# Patient Record
Sex: Female | Born: 1985 | Hispanic: No | Marital: Married | Smoking: Never smoker
Health system: Southern US, Community
[De-identification: ages and names within clinical notes are randomized; demographics above are authoritative.]

## PROBLEM LIST (undated history)

## (undated) ENCOUNTER — Inpatient Hospital Stay (HOSPITAL_COMMUNITY): Payer: Self-pay

## (undated) DIAGNOSIS — T7840XA Allergy, unspecified, initial encounter: Secondary | ICD-10-CM

## (undated) DIAGNOSIS — R51 Headache: Secondary | ICD-10-CM

## (undated) DIAGNOSIS — J45909 Unspecified asthma, uncomplicated: Secondary | ICD-10-CM

## (undated) HISTORY — PX: NO PAST SURGERIES: SHX2092

## (undated) HISTORY — DX: Allergy, unspecified, initial encounter: T78.40XA

## (undated) HISTORY — DX: Unspecified asthma, uncomplicated: J45.909

## (undated) HISTORY — PX: IUD REMOVAL: SHX5392

---

## 2009-01-09 ENCOUNTER — Emergency Department (HOSPITAL_COMMUNITY): Admission: EM | Admit: 2009-01-09 | Discharge: 2009-01-09 | Payer: Self-pay | Admitting: Emergency Medicine

## 2009-03-29 ENCOUNTER — Other Ambulatory Visit: Admission: RE | Admit: 2009-03-29 | Discharge: 2009-03-29 | Payer: Self-pay | Admitting: Obstetrics and Gynecology

## 2009-06-23 ENCOUNTER — Inpatient Hospital Stay (HOSPITAL_COMMUNITY): Admission: AD | Admit: 2009-06-23 | Discharge: 2009-06-24 | Payer: Self-pay | Admitting: Obstetrics and Gynecology

## 2009-09-13 ENCOUNTER — Ambulatory Visit (HOSPITAL_COMMUNITY): Admission: RE | Admit: 2009-09-13 | Discharge: 2009-09-13 | Payer: Self-pay | Admitting: Obstetrics and Gynecology

## 2009-09-22 ENCOUNTER — Inpatient Hospital Stay (HOSPITAL_COMMUNITY): Admission: AD | Admit: 2009-09-22 | Discharge: 2009-09-22 | Payer: Self-pay | Admitting: Obstetrics and Gynecology

## 2010-07-06 ENCOUNTER — Inpatient Hospital Stay (HOSPITAL_COMMUNITY): Admission: AD | Admit: 2010-07-06 | Discharge: 2009-10-09 | Payer: Self-pay | Admitting: Obstetrics and Gynecology

## 2010-10-23 LAB — CBC
HCT: 34.5 % — ABNORMAL LOW (ref 36.0–46.0)
Hemoglobin: 11 g/dL — ABNORMAL LOW (ref 12.0–15.0)
MCHC: 33 g/dL (ref 30.0–36.0)
MCHC: 33.1 g/dL (ref 30.0–36.0)
MCV: 86 fL (ref 78.0–100.0)
Platelets: 156 10*3/uL (ref 150–400)
Platelets: 192 10*3/uL (ref 150–400)
RBC: 4 MIL/uL (ref 3.87–5.11)
RDW: 13.6 % (ref 11.5–15.5)
WBC: 11.7 10*3/uL — ABNORMAL HIGH (ref 4.0–10.5)

## 2010-10-23 LAB — RPR: RPR Ser Ql: NONREACTIVE

## 2010-11-01 LAB — URINALYSIS, ROUTINE W REFLEX MICROSCOPIC
Ketones, ur: NEGATIVE mg/dL
Nitrite: NEGATIVE
Specific Gravity, Urine: 1.025 (ref 1.005–1.030)
pH: 5.5 (ref 5.0–8.0)

## 2010-11-06 LAB — COMPREHENSIVE METABOLIC PANEL
ALT: 12 U/L (ref 0–35)
AST: 17 U/L (ref 0–37)
Albumin: 3.6 g/dL (ref 3.5–5.2)
Alkaline Phosphatase: 57 U/L (ref 39–117)
BUN: 9 mg/dL (ref 6–23)
CO2: 23 mEq/L (ref 19–32)
Calcium: 9.3 mg/dL (ref 8.4–10.5)
Chloride: 105 mEq/L (ref 96–112)
Creatinine, Ser: 0.67 mg/dL (ref 0.4–1.2)
GFR calc Af Amer: >60 mL/min (ref >60)
GFR calc non Af Amer: >60 mL/min (ref >60)
Glucose, Bld: 91 mg/dL (ref 70–99)
Potassium: 4.2 mEq/L (ref 3.5–5.1)
Sodium: 136 mEq/L (ref 135–145)
Total Bilirubin: 0.6 mg/dL (ref 0.3–1.2)
Total Protein: 7.4 g/dL (ref 6.0–8.3)

## 2010-11-06 LAB — DIFFERENTIAL
Eosinophils Absolute: 0.1 10*3/uL (ref 0.0–0.7)
Eosinophils Relative: 2 % (ref 0–5)
Lymphs Abs: 1.7 10*3/uL (ref 0.7–4.0)
Monocytes Relative: 6 % (ref 3–12)

## 2010-11-06 LAB — WET PREP, GENITAL
Clue Cells Wet Prep HPF POC: NONE SEEN
Trich, Wet Prep: NONE SEEN
Yeast Wet Prep HPF POC: NONE SEEN

## 2010-11-06 LAB — URINALYSIS, ROUTINE W REFLEX MICROSCOPIC
Bilirubin Urine: NEGATIVE
Glucose, UA: NEGATIVE mg/dL
Urobilinogen, UA: 0.2 mg/dL (ref 0.0–1.0)
pH: 5 (ref 5.0–8.0)

## 2010-11-06 LAB — URINE CULTURE: Colony Count: 50000

## 2010-11-06 LAB — CBC
HCT: 38.6 % (ref 36.0–46.0)
Hemoglobin: 12.7 g/dL (ref 12.0–15.0)
MCHC: 33 g/dL (ref 30.0–36.0)
MCV: 81.3 fL (ref 78.0–100.0)
Platelets: 186 10*3/uL (ref 150–400)
RBC: 4.74 MIL/uL (ref 3.87–5.11)
RDW: 13.1 % (ref 11.5–15.5)
WBC: 6 10*3/uL (ref 4.0–10.5)

## 2010-11-06 LAB — GC/CHLAMYDIA PROBE AMP, GENITAL
Chlamydia, DNA Probe: NEGATIVE
GC Probe Amp, Genital: NEGATIVE

## 2010-11-19 ENCOUNTER — Inpatient Hospital Stay (HOSPITAL_COMMUNITY)
Admission: AD | Admit: 2010-11-19 | Discharge: 2010-11-19 | Disposition: A | Discharge status: Home / Self Care | Payer: 59 | Source: Ambulatory Visit | Attending: Obstetrics and Gynecology | Admitting: Obstetrics and Gynecology

## 2010-11-19 DIAGNOSIS — O479 False labor, unspecified: Secondary | ICD-10-CM | POA: Insufficient documentation

## 2010-11-20 ENCOUNTER — Inpatient Hospital Stay (HOSPITAL_COMMUNITY)
Admission: RE | Admit: 2010-11-20 | Discharge: 2010-11-22 | DRG: 775 | Disposition: A | Discharge status: Home / Self Care | Payer: 59 | Source: Ambulatory Visit | Attending: Obstetrics and Gynecology | Admitting: Obstetrics and Gynecology

## 2010-11-20 DIAGNOSIS — O99892 Other specified diseases and conditions complicating childbirth: Principal | ICD-10-CM | POA: Diagnosis present

## 2010-11-20 DIAGNOSIS — Z22330 Carrier of Group B streptococcus: Secondary | ICD-10-CM

## 2010-11-20 DIAGNOSIS — N90810 Female genital mutilation status, unspecified: Secondary | ICD-10-CM | POA: Diagnosis present

## 2010-11-20 LAB — CBC
HCT: 34 % — ABNORMAL LOW (ref 36.0–46.0)
Hemoglobin: 11 g/dL — ABNORMAL LOW (ref 12.0–15.0)
MCH: 26.1 pg (ref 26.0–34.0)
RBC: 4.22 MIL/uL (ref 3.87–5.11)
WBC: 8.5 10*3/uL (ref 4.0–10.5)

## 2010-11-21 LAB — CBC
HCT: 34.8 % — ABNORMAL LOW (ref 36.0–46.0)
RBC: 4.26 MIL/uL (ref 3.87–5.11)
RDW: 15 % (ref 11.5–15.5)
WBC: 12 10*3/uL — ABNORMAL HIGH (ref 4.0–10.5)

## 2010-12-05 NOTE — Discharge Summary (Signed)
NAMEKATHIA, [name]              ACCOUNT NO.:  [id number]  MEDICAL RECORD NO.:  [id number]           PATIENT TYPE:  I  LOCATION:  9114                          FACILITY:  [facility]  PHYSICIAN:  Sherron Monday, MD        DATE OF BIRTH:  Jan 28, 1986  DATE OF ADMISSION:  11/20/2010 DATE OF DISCHARGE:  11/22/2010                              DISCHARGE SUMMARY   ADMITTING DIAGNOSIS:  Intrauterine pregnancy at 39 plus weeks for induction of labor.  DISCHARGE DIAGNOSIS:  Intrauterine pregnancy at 39 plus weeks for induction of labor, delivered.  HISTORY OF PRESENT ILLNESS:  A 25 year old G2, P1-0-0-1 at 23 plus weeks for induction of labor given lagging abdominal circumference, favorable cervix, and fundal height less than dates.  She states she has had good fetal movement, no loss of fluid, no vaginal bleeding, and occasional contractions.  She started her prenatal care at [facility].  Has limited English.  Admits group B strep as well as had an UTI in her pregnancy, otherwise uncomplicated prenatal care.  PAST MEDICAL HISTORY:  Significant for asthma as a child.  PAST SURGICAL HISTORY:  Not significant.  PAST GYN HISTORY:  G1 was in October 01, 2009, 39 weeks, 5-pound and 6- ounce female infant.  G2 is present pregnancy.  No history of any abnormal Pap smears or sexually transmitted diseases.  MEDICATIONS:  Prenatal vitamins.  ALLERGIES:  PENICILLIN.  No latex allergy.  SOCIAL HISTORY:  Denies alcohol, tobacco, or drug use.  She is married.  FAMILY HISTORY:  Significant for diabetes.  PRENATAL LABS:  She is A positive, antibody screen negative, hemoglobin 11.4, Pap smear within normal limits, rubella nonimmune, RPR nonreactive, urine culture negative, hepatitis B surface antigen negative, HIV negative, platelets 223,000, gonorrhea negative, Chlamydia negative, cystic fibrosis screen negative, first trimester screen within normal limits, group B strep was positive.  Ultrasound  confirmed an [facility] of November 26, 2010, normal anatomy, anterior placenta.  The patient did not desire to note gender at that time.  PHYSICAL EXAMINATION:  VITAL SIGNS:  Afebrile.  Vital signs stable. GENERAL:  No apparent distress. CARDIOVASCULAR:  Regular rate and rhythm. LUNGS:  Clear to auscultation bilaterally. ABDOMEN:  Soft.  Fundus nontender. EXTREMITIES:  Symmetric and nontender.  Fetal heart tones are in the 140s and reactive with occasional contractions.  Cervix with a large external orifice, 4-5 cm dilated, 90% effaced, and 0 to +1 station.  She was admitted for induction of labor/augmentation.  She is group B strep positive with unknown sensitivities and penicillin allergic.  She will be treated with vancomycin as her reaction is an anaphylactic one.  Will be placed on SCD, epidural p.r.n., augment as needed with Pitocin, vancomycin.  She progressed rapidly, had an SROM for clear fluid.  An epidural was placed, never got comfortable.  She progressed to complete, complete, +2.  She pushed for 4-5 contractions to deliver a viable female infant at 9:27 a.m. with Apgars of 9 at 1 minute and 9 at 5 minutes and a weight of 5 pounds and 6 ounces.  Placenta was then expressed intact at 9:29.  The  patient want female circumcision.  Bilateral periurethral lacerations repaired with 3-0 Vicryl Rapide with local block.  EBL was less than 500 mL, did not get the full dose of vancomycin.  Her postpartum course was relatively uncomplicated.  Her hemoglobin was stable from 11 to 11.1.  She was discharged home on postpartum day #2 without complaints except mild dizziness.  The patient was advised to hydrate, otherwise her pain was well controlled, normal lochia.  She was discharged home with routine discharge instructions and numbers to call with any questions or problems, prescriptions for Motrin, Vicodin, and prenatal vitamins.  She will follow up in approximately 6 weeks.  She is A  positive, rubella nonimmune, will receive rubella injection prior to discharge.  She is breast-feeding.  Hemoglobin is stable from 11 to 11.1.  We will discuss contraception at her 6-week check.     Sherron Monday, MD     JB/MEDQ  D:  11/22/2010  T:  11/22/2010  Job:  [id number]  Electronically Signed by Sherron Monday MD on 12/05/2010 08:49:29 AM

## 2012-05-01 ENCOUNTER — Ambulatory Visit (INDEPENDENT_AMBULATORY_CARE_PROVIDER_SITE_OTHER): Payer: 59 | Admitting: Family Medicine

## 2012-05-01 VITALS — BP 104/69 | HR 90 | Temp 98.3°F | Resp 16 | Ht 64.0 in | Wt 149.2 lb

## 2012-05-01 DIAGNOSIS — J029 Acute pharyngitis, unspecified: Secondary | ICD-10-CM

## 2012-05-01 DIAGNOSIS — R42 Dizziness and giddiness: Secondary | ICD-10-CM

## 2012-05-01 DIAGNOSIS — M549 Dorsalgia, unspecified: Secondary | ICD-10-CM

## 2012-05-01 DIAGNOSIS — R110 Nausea: Secondary | ICD-10-CM

## 2012-05-01 DIAGNOSIS — R300 Dysuria: Secondary | ICD-10-CM

## 2012-05-01 DIAGNOSIS — R6883 Chills (without fever): Secondary | ICD-10-CM

## 2012-05-01 LAB — POCT CBC
Granulocyte percent: 69.3 % (ref 37–80)
HCT, POC: 40.6 % (ref 37.7–47.9)
Hemoglobin: 13.3 g/dL (ref 12.2–16.2)
Lymph, poc: 2.1 (ref 0.6–3.4)
MCH, POC: 27.8 pg (ref 27–31.2)
MCHC: 32.8 g/dL (ref 31.8–35.4)
MCV: 84.7 fL (ref 80–97)
MID (cbc): 0.4 (ref 0–0.9)
MPV: 8.8 fL (ref 0–99.8)
POC Granulocyte: 5.8 (ref 2–6.9)
POC LYMPH PERCENT: 25.9 %L (ref 10–50)
POC MID %: 4.8 %M (ref 0–12)
Platelet Count, POC: 223 10*3/uL (ref 142–424)
RBC: 4.79 M/uL (ref 4.04–5.48)
RDW, POC: 13.7 %
WBC: 8.3 10*3/uL (ref 4.6–10.2)

## 2012-05-01 LAB — POCT UA - MICROSCOPIC ONLY
Casts, Ur, LPF, POC: NEGATIVE
Crystals, Ur, HPF, POC: NEGATIVE
Yeast, UA: NEGATIVE

## 2012-05-01 LAB — POCT URINALYSIS DIPSTICK
Bilirubin, UA: NEGATIVE
Glucose, UA: NEGATIVE
Ketones, UA: NEGATIVE
Leukocytes, UA: NEGATIVE
Nitrite, UA: NEGATIVE
Protein, UA: NEGATIVE
Spec Grav, UA: 1.01
Urobilinogen, UA: 0.2
pH, UA: 5

## 2012-05-01 LAB — POCT RAPID STREP A (OFFICE): Rapid Strep A Screen: NEGATIVE

## 2012-05-01 LAB — POCT URINE PREGNANCY: Preg Test, Ur: NEGATIVE

## 2012-05-01 MED ORDER — CIPROFLOXACIN HCL 250 MG PO TABS: 250.0000 mg | ORAL_TABLET | Freq: Two times a day (BID) | ORAL | Status: DC

## 2012-05-01 NOTE — Progress Notes (Signed)
[facility]:  Office Visit  Chief Complaint:  Chief Complaint  Patient presents with  . Back Pain    3 days  . Sore Throat    3 days  . Dizziness  . abdomen pain    HPI: [name] is a 26 y.o. female who complains of  3 day h/o right sided back pain, nausea, dizziness, dysuria. Sore throat for over 2 months. Chills with no objective fevers. Has tried nothing. She has an IUD which she cannot feel. So she is scheduled to get an [place] on Monday. + Nausea  History reviewed. No pertinent past medical history. History reviewed. No pertinent past surgical history. History   Social History  . Marital Status: Married    Spouse Name: N/A    Number of Children: N/A  . Years of Education: N/A   Social History Main Topics  . Smoking status: Never Smoker   . Smokeless tobacco: None  . Alcohol Use: No  . Drug Use: No  . Sexually Active: None   Other Topics Concern  . None   Social History Narrative  . None   No family history on file. Allergies  Allergen Reactions  . Penicillins Cross Reactors Shortness Of Breath   Prior to Admission medications   Not on File     ROS: The patient denies fevers, chills, night sweats, unintentional weight loss, chest pain, palpitations, wheezing, dyspnea on exertion, vomiting, hematuria, melena, numbness, weakness, or tingling. + nausea  All other systems have been reviewed and were otherwise negative with the exception of those mentioned in the HPI and as above.    PHYSICAL EXAM: Filed Vitals:   05/01/12 2009  BP: 104/69  Pulse: 90  Temp: 98.3 F (36.8 C)  Resp: 16   Filed Vitals:   05/01/12 2009  Height: 5\' 4"  (1.626 m)  Weight: 149 lb 3.2 oz (67.677 kg)   Body mass index is 25.61 kg/(m^2).  General: Alert, no acute distress HEENT:  Normocephalic, atraumatic, oropharynx patent. EOMI, PERRLA. No exudates. Mild redness tonsils Cardiovascular:  Regular rate and rhythm, no rubs murmurs or gallops.  No  Carotid bruits, radial pulse intact. No pedal edema.  Respiratory: Clear to auscultation bilaterally.  No wheezes, rales, or rhonchi.  No cyanosis, no use of accessory musculature GI: No organomegaly, abdomen is soft and non-tender, positive bowel sounds.  No masses. Skin: No rashes. Neurologic: Facial musculature symmetric. Psychiatric: Patient is appropriate throughout our interaction. Lymphatic: No cervical lymphadenopathy Musculoskeletal: Gait intact.   LABS: Results for orders placed in visit on 05/01/12  POCT CBC      Component Value Range   WBC 8.3  4.6 - 10.2 K/uL   Lymph, poc 2.1  0.6 - 3.4   POC LYMPH PERCENT 25.9  10 - 50 %L   MID (cbc) 0.4  0 - 0.9   POC MID % 4.8  0 - 12 %M   POC Granulocyte 5.8  2 - 6.9   Granulocyte percent 69.3  37 - 80 %G   RBC 4.79  4.04 - 5.48 M/uL   Hemoglobin 13.3  12.2 - 16.2 g/dL   HCT, POC 78.2  95.6 - 47.9 %   MCV 84.7  80 - 97 fL   MCH, POC 27.8  27 - 31.2 pg   MCHC 32.8  31.8 - 35.4 g/dL   RDW, POC 21.3     Platelet Count, POC 223  142 - 424 K/uL   MPV 8.8  0 - 99.8 fL  POCT UA - MICROSCOPIC ONLY      Component Value Range   WBC, Ur, HPF, POC 0-4     RBC, urine, microscopic 0-2     Bacteria, U Microscopic 3+     Mucus, UA moderate     Epithelial cells, urine per micros 10-20     Crystals, Ur, HPF, POC neg     Casts, Ur, LPF, POC neg     Yeast, UA neg    POCT URINALYSIS DIPSTICK      Component Value Range   Color, UA yellow     Clarity, UA slightly cloudy     Glucose, UA neg     Bilirubin, UA neg     Ketones, UA neg     Spec Grav, UA 1.010     Blood, UA trace-intcact     pH, UA 5.0     Protein, UA neg     Urobilinogen, UA 0.2     Nitrite, UA neg     Leukocytes, UA Negative    POCT URINE PREGNANCY      Component Value Range   Preg Test, Ur Negative    POCT RAPID STREP A (OFFICE)      Component Value Range   Rapid Strep A Screen Negative  Negative     EKG/XRAY:   Primary read interpreted by Dr. Conley Rolls at  [facility].   ASSESSMENT/PLAN: Encounter Diagnoses  Name Primary?  . Pharyngitis Yes  . Back pain   . Dizziness   . Nausea   . Chills   . Dysuria    Rx Cipro 250 mg BID x 3 days Culture urine Will get [place] as scheduled from OB If sxs still persist then need to return for further evaluation after Monday's [place]     Darrelle Wiberg PHUONG, DO 05/01/2012 9:18 PM

## 2012-05-03 LAB — URINE CULTURE: Colony Count: 25000

## 2012-05-26 ENCOUNTER — Other Ambulatory Visit: Payer: Self-pay | Admitting: Obstetrics and Gynecology

## 2012-05-29 ENCOUNTER — Inpatient Hospital Stay (HOSPITAL_COMMUNITY): Admission: RE | Admit: 2012-05-29 | Payer: 59 | Source: Ambulatory Visit

## 2012-05-29 ENCOUNTER — Other Ambulatory Visit (HOSPITAL_COMMUNITY): Payer: 59

## 2012-05-29 ENCOUNTER — Encounter (HOSPITAL_COMMUNITY)
Admission: RE | Admit: 2012-05-29 | Discharge: 2012-05-29 | Disposition: A | Discharge status: Home / Self Care | Payer: 59 | Source: Ambulatory Visit | Attending: Obstetrics and Gynecology | Admitting: Obstetrics and Gynecology

## 2012-05-29 ENCOUNTER — Encounter (HOSPITAL_COMMUNITY): Payer: Self-pay

## 2012-05-29 HISTORY — DX: Headache: R51

## 2012-05-29 LAB — CBC
HCT: 37.9 % (ref 36.0–46.0)
Hemoglobin: 12.4 g/dL (ref 12.0–15.0)
MCHC: 32.7 g/dL (ref 30.0–36.0)
RDW: 13.1 % (ref 11.5–15.5)
WBC: 7.3 10*3/uL (ref 4.0–10.5)

## 2012-05-29 NOTE — Pre-Procedure Instructions (Signed)
PAT assessment with aid of Language Resources [facility]

## 2012-05-29 NOTE — Patient Instructions (Addendum)
   Your procedure is scheduled ZO:[id number] November 4th  Enter through the Main Entrance of [facility] at:10:15am Pick up the phone at the desk and dial [phone] and inform us of your arrival.  Please call this number if you have any problems the morning of surgery: [phone]  Remember: Do not eat or drink anything after midnight on Sunday    Do not wear jewelry, make-up, or FINGER nail polish No metal in your hair or on your body. Do not wear lotions, powders, perfumes. You may wear deodorant.  Please use your CHG wash as directed prior to surgery.  Do not shave anywhere for at least 12 hours prior to first CHG shower.  Do not bring valuables to the hospital.     Patients discharged on the day of surgery will not be allowed to drive home.

## 2012-06-02 ENCOUNTER — Encounter (HOSPITAL_COMMUNITY)
Admission: RE | Disposition: A | Discharge status: Home / Self Care | Payer: Self-pay | Source: Ambulatory Visit | Attending: Obstetrics and Gynecology

## 2012-06-02 ENCOUNTER — Ambulatory Visit (HOSPITAL_COMMUNITY): Payer: 59 | Admitting: Certified Registered Nurse Anesthetist

## 2012-06-02 ENCOUNTER — Encounter (HOSPITAL_COMMUNITY): Payer: Self-pay | Admitting: Certified Registered Nurse Anesthetist

## 2012-06-02 ENCOUNTER — Ambulatory Visit (HOSPITAL_COMMUNITY)
Admission: RE | Admit: 2012-06-02 | Discharge: 2012-06-02 | Disposition: A | Discharge status: Home / Self Care | Payer: 59 | Source: Ambulatory Visit | Attending: Obstetrics and Gynecology | Admitting: Obstetrics and Gynecology

## 2012-06-02 DIAGNOSIS — Z01812 Encounter for preprocedural laboratory examination: Secondary | ICD-10-CM | POA: Insufficient documentation

## 2012-06-02 DIAGNOSIS — N949 Unspecified condition associated with female genital organs and menstrual cycle: Secondary | ICD-10-CM | POA: Insufficient documentation

## 2012-06-02 DIAGNOSIS — Z01818 Encounter for other preprocedural examination: Secondary | ICD-10-CM | POA: Insufficient documentation

## 2012-06-02 DIAGNOSIS — T8332XA Displacement of intrauterine contraceptive device, initial encounter: Secondary | ICD-10-CM

## 2012-06-02 DIAGNOSIS — Z30432 Encounter for removal of intrauterine contraceptive device: Secondary | ICD-10-CM | POA: Insufficient documentation

## 2012-06-02 HISTORY — PX: IUD REMOVAL: SHX5392

## 2012-06-02 HISTORY — PX: HYSTEROSCOPY WITH D & C: SHX1775

## 2012-06-02 SURGERY — DILATATION AND CURETTAGE /HYSTEROSCOPY
Anesthesia: General | Site: Vagina | Wound class: Clean Contaminated

## 2012-06-02 MED ORDER — KETOROLAC TROMETHAMINE 30 MG/ML IJ SOLN
INTRAMUSCULAR | Status: DC | PRN
  Administered 2012-06-02: 30 mg via INTRAVENOUS

## 2012-06-02 MED ORDER — MIDAZOLAM HCL 2 MG/2ML IJ SOLN
INTRAMUSCULAR | Status: AC
  Filled 2012-06-02: qty 2

## 2012-06-02 MED ORDER — LACTATED RINGERS IV SOLN
INTRAVENOUS | Status: DC
Start: 2012-06-02 — End: 2012-06-02
  Administered 2012-06-02 (×2): via INTRAVENOUS

## 2012-06-02 MED ORDER — MEPERIDINE HCL 25 MG/ML IJ SOLN: 6.2500 mg | INTRAMUSCULAR | Status: DC | PRN

## 2012-06-02 MED ORDER — PROPOFOL 10 MG/ML IV EMUL
INTRAVENOUS | Status: AC
  Filled 2012-06-02: qty 20

## 2012-06-02 MED ORDER — ONDANSETRON HCL 4 MG/2ML IJ SOLN
INTRAMUSCULAR | Status: DC | PRN
  Administered 2012-06-02: 4 mg via INTRAVENOUS

## 2012-06-02 MED ORDER — DEXAMETHASONE SODIUM PHOSPHATE 4 MG/ML IJ SOLN
INTRAMUSCULAR | Status: DC | PRN
  Administered 2012-06-02: 10 mg via INTRAVENOUS

## 2012-06-02 MED ORDER — LIDOCAINE HCL (CARDIAC) 20 MG/ML IV SOLN
INTRAVENOUS | Status: AC
  Filled 2012-06-02: qty 5

## 2012-06-02 MED ORDER — MIDAZOLAM HCL 2 MG/2ML IJ SOLN: 0.5000 mg | Freq: Once | INTRAMUSCULAR | Status: DC | PRN

## 2012-06-02 MED ORDER — GLYCINE 1.5 % IR SOLN
Status: DC | PRN
  Administered 2012-06-02: 3000 mL

## 2012-06-02 MED ORDER — FENTANYL CITRATE 0.05 MG/ML IJ SOLN
INTRAMUSCULAR | Status: DC | PRN
  Administered 2012-06-02: 25 ug via INTRAVENOUS
  Administered 2012-06-02: 50 ug via INTRAVENOUS
  Administered 2012-06-02: 25 ug via INTRAVENOUS

## 2012-06-02 MED ORDER — BUPIVACAINE HCL (PF) 0.25 % IJ SOLN
INTRAMUSCULAR | Status: AC
  Filled 2012-06-02: qty 30

## 2012-06-02 MED ORDER — MIDAZOLAM HCL 5 MG/5ML IJ SOLN
INTRAMUSCULAR | Status: DC | PRN
  Administered 2012-06-02: 2 mg via INTRAVENOUS

## 2012-06-02 MED ORDER — IBUPROFEN 600 MG PO TABS: 600.0000 mg | ORAL_TABLET | Freq: Four times a day (QID) | ORAL | Status: DC | PRN

## 2012-06-02 MED ORDER — PROPOFOL 10 MG/ML IV EMUL
INTRAVENOUS | Status: DC | PRN
  Administered 2012-06-02: 150 mg via INTRAVENOUS

## 2012-06-02 MED ORDER — KETOROLAC TROMETHAMINE 30 MG/ML IJ SOLN: 15.0000 mg | Freq: Once | INTRAMUSCULAR | Status: DC | PRN

## 2012-06-02 MED ORDER — FENTANYL CITRATE 0.05 MG/ML IJ SOLN
INTRAMUSCULAR | Status: AC
  Filled 2012-06-02: qty 2

## 2012-06-02 MED ORDER — SILVER NITRATE-POT NITRATE 75-25 % EX MISC
CUTANEOUS | Status: AC
  Filled 2012-06-02: qty 1

## 2012-06-02 MED ORDER — SILVER NITRATE-POT NITRATE 75-25 % EX MISC
CUTANEOUS | Status: DC | PRN
  Administered 2012-06-02: 2 via TOPICAL

## 2012-06-02 MED ORDER — KETOROLAC TROMETHAMINE 30 MG/ML IJ SOLN
INTRAMUSCULAR | Status: AC
  Filled 2012-06-02: qty 1

## 2012-06-02 MED ORDER — FENTANYL CITRATE 0.05 MG/ML IJ SOLN: 25.0000 ug | INTRAMUSCULAR | Status: DC | PRN

## 2012-06-02 MED ORDER — PROMETHAZINE HCL 25 MG/ML IJ SOLN: 6.2500 mg | INTRAMUSCULAR | Status: DC | PRN

## 2012-06-02 MED ORDER — ONDANSETRON HCL 4 MG/2ML IJ SOLN
INTRAMUSCULAR | Status: AC
  Filled 2012-06-02: qty 2

## 2012-06-02 MED ORDER — DEXAMETHASONE SODIUM PHOSPHATE 10 MG/ML IJ SOLN
INTRAMUSCULAR | Status: AC
  Filled 2012-06-02: qty 1

## 2012-06-02 MED ORDER — LIDOCAINE HCL (CARDIAC) 20 MG/ML IV SOLN
INTRAVENOUS | Status: DC | PRN
  Administered 2012-06-02: 50 mg via INTRAVENOUS

## 2012-06-02 MED ORDER — BUPIVACAINE HCL (PF) 0.25 % IJ SOLN
INTRAMUSCULAR | Status: DC | PRN
  Administered 2012-06-02: 20 mL

## 2012-06-02 SURGICAL SUPPLY — 17 items
CANISTER SUCTION 2500CC (MISCELLANEOUS) ×2 IMPLANT
CATH ROBINSON RED A/P 16FR (CATHETERS) ×2 IMPLANT
CLOTH BEACON ORANGE TIMEOUT ST (SAFETY) ×2 IMPLANT
CONTAINER PREFILL 10% NBF 60ML (FORM) IMPLANT
DRESSING TELFA 8X3 (GAUZE/BANDAGES/DRESSINGS) ×2 IMPLANT
ELECT REM PT RETURN 9FT ADLT (ELECTROSURGICAL)
ELECTRODE REM PT RTRN 9FT ADLT (ELECTROSURGICAL) IMPLANT
GLOVE BIOGEL M 6.5 STRL (GLOVE) ×4 IMPLANT
GLOVE BIOGEL PI IND STRL 6.5 (GLOVE) ×2 IMPLANT
GLOVE BIOGEL PI INDICATOR 6.5 (GLOVE) ×2
GOWN PREVENTION PLUS XLARGE (GOWN DISPOSABLE) ×2 IMPLANT
GOWN STRL REIN XL XLG (GOWN DISPOSABLE) ×4 IMPLANT
LOOP ANGLED CUTTING 22FR (CUTTING LOOP) IMPLANT
PACK HYSTEROSCOPY LF (CUSTOM PROCEDURE TRAY) ×2 IMPLANT
PAD OB MATERNITY 4.3X12.25 (PERSONAL CARE ITEMS) ×2 IMPLANT
TOWEL OR 17X24 6PK STRL BLUE (TOWEL DISPOSABLE) ×4 IMPLANT
WATER STERILE IRR 1000ML POUR (IV SOLUTION) ×2 IMPLANT

## 2012-06-02 NOTE — Anesthesia Postprocedure Evaluation (Signed)
Anesthesia Post Note  Patient: [name]  Procedure(s) Performed: Procedure(s) (LRB): DILATATION AND CURETTAGE /HYSTEROSCOPY (N/A) INTRAUTERINE DEVICE (IUD) REMOVAL (N/A)  Anesthesia type: General  Patient location: PACU  Post pain: Pain level controlled  Post assessment: Post-op Vital signs reviewed  Last Vitals:  Filed Vitals:   06/02/12 1500  BP: 103/62  Pulse: 68  Temp:   Resp: 14    Post vital signs: Reviewed  Level of consciousness: sedated  Complications: No apparent anesthesia complications

## 2012-06-02 NOTE — Op Note (Signed)
06/02/2012  2:58 PM  PATIENT:  [name]  26 y.o. female  PRE-OPERATIVE DIAGNOSIS:  retained IUD  POST-OPERATIVE DIAGNOSIS:  Retained Intra-uterine device  PROCEDURE:  Procedure(s) (LRB) with comments: DILATATION AND CURETTAGE /HYSTEROSCOPY (N/A) INTRAUTERINE DEVICE (IUD) REMOVAL (N/A)  SURGEON:  Surgeon(s) and Role:    * Emelio Schneller J. Richardson Dopp, MD - Primary  PHYSICIAN ASSISTANT: None  ASSISTANTS: none   ANESTHESIA:   general  EBL:  Total I/O In: 1300 [I.V.:1300] Out: 35 [Urine:25; Blood:10]  BLOOD ADMINISTERED:none  DRAINS: none   LOCAL MEDICATIONS USED:  MARCAINE   20 cc plain   SPECIMEN:  No Specimen  DISPOSITION OF SPECIMEN:  N/A  COUNTS:  YES  TOURNIQUET:  * No tourniquets in log *  DICTATION: .Dragon Dictation  PLAN OF CARE: Discharge to home after PACU  PATIENT DISPOSITION:  PACU - hemodynamically stable.   Delay start of Pharmacological VTE agent (>24hrs) due to surgical blood loss or risk of bleeding: not applicable  Findings: IUD within the endometrium.. Normal appearing  Endometrium   Procedure: Pt was taken to the OR where she was placed under general anesthesia. A time out was performed.Marland Kitchen She was placed in the dorsal lithotomy position.  She was prepped and draped in the usual sterile fashion. A speculum was placed in the vaginal vault.  The anterior lip of the cervix was grasped with a single tooth tenaculum. 10 cc of 25% marcaine was injected at the 4 oclock and 8 oclock positions. The hysteroscope was introduced and the IUD was visualized. Hysteroscopic forceps were introduced and the IUD strings were grasped. The IUD was removed without difficulty. The hysteroscope was reinserted. THere was no evidence of perforation. All instruments were removed from the vagina.  Sponge lap and instrument counts were correct times 2. Pt was taken to the recovery room awake and in stable condition.

## 2012-06-02 NOTE — Preoperative (Signed)
Beta Blockers   Reason not to administer Beta Blockers:Not Applicable 

## 2012-06-02 NOTE — Anesthesia Procedure Notes (Signed)
Procedure Name: LMA Insertion Date/Time: 06/02/2012 1:07 PM Performed by: Daiki Dicostanzo A Pre-anesthesia Checklist: Patient identified, Emergency Drugs available, Suction available, Patient being monitored and Timeout performed Patient Re-evaluated:Patient Re-evaluated prior to inductionOxygen Delivery Method: Circle system utilized Preoxygenation: Pre-oxygenation with 100% oxygen Intubation Type: IV induction Ventilation: Mask ventilation without difficulty LMA: LMA inserted LMA Size: 4.0 Number of attempts: 1 Tube secured with: Tape Dental Injury: Teeth and Oropharynx as per pre-operative assessment

## 2012-06-02 NOTE — H&P (Signed)
[name] is an 26 y.o. female. Present for hysteoscopy And removal of IUD. She had an attempted IUD removal in the office that was unsuccessful under u/s guidance. She had pain during the procedure. Pertinent Gynecological History: Menses: flow is light last menses 05/08/2012 Bleeding: NA Contraception: IUD DES exposure: denies Blood transfusions: none Sexually transmitted diseases: no past history Previous GYN Procedures: NA  Last mammogram: NA Date: NA Last pap: normal Date:  OB History: G2, P2   Menstrual History: Menarche age: 15No LMP recorded.    Past Medical History  Diagnosis Date  . Headache     Past Surgical History  Procedure Date  . No past surgeries     No family history on file.  Social History:  reports that she has never smoked. She does not have any smokeless tobacco history on file. She reports that she does not drink alcohol or use illicit drugs.  Allergies:  Allergies  Allergen Reactions  . Penicillins Cross Reactors Shortness Of Breath    Prescriptions prior to admission  Medication Sig Dispense Refill  . ciprofloxacin (CIPRO) 250 MG tablet Take 1 tablet (250 mg total) by mouth 2 (two) times daily.  6 tablet  0  . Multiple Vitamin (MULTIVITAMIN WITH MINERALS) TABS Take 1 tablet by mouth daily.        Review of Systems  All other systems reviewed and are negative.    Blood pressure 105/69, pulse 77, temperature 98.1 F (36.7 C), temperature source Oral, resp. rate 16, height 5\' 6"  (1.676 m), weight 67.132 kg (148 lb), SpO2 100.00%. Physical Exam  Constitutional: She is oriented to person, place, and time. She appears well-developed and well-nourished.  Cardiovascular: Normal rate and regular rhythm.   Respiratory: Breath sounds normal.  GI: Soft. Bowel sounds are normal.  Genitourinary: Vagina normal and uterus normal.  Musculoskeletal: Normal range of motion.  Neurological: She is alert and oriented to person, place, and time.     Results for orders placed during the hospital encounter of 06/02/12 (from the past 24 hour(s))  PREGNANCY, URINE     Status: Normal   Collection Time   06/02/12 10:39 AM      Component Value Range   Preg Test, Ur NEGATIVE  NEGATIVE    No results found.  Assessment/Plan:  pt with retained IUD failed removal int he office under u/s guidance. She desires removal of her IUD due to pain. R/B/A of hysteroscopy and IUD removal d/w pt including but not limted to infection bleeding possible perforation of the uterus with the need for further surgery.. Pt voiced understanding and desires to proceed   Davin Muramoto J. 06/02/2012, 12:44 PM

## 2012-06-02 NOTE — Anesthesia Preprocedure Evaluation (Signed)
Anesthesia Evaluation  Patient identified by MRN, date of birth, ID band Patient awake    Reviewed: Allergy & Precautions, H&P , Patient's Chart, lab work & pertinent test results, reviewed documented beta blocker date and time   History of Anesthesia Complications Negative for: history of anesthetic complications  Airway Mallampati: II TM Distance: >3 FB Neck ROM: full    Dental No notable dental hx.    Pulmonary neg pulmonary ROS,  breath sounds clear to auscultation  Pulmonary exam normal       Cardiovascular Exercise Tolerance: Good negative cardio ROS  Rhythm:regular Rate:Normal     Neuro/Psych  Headaches, negative neurological ROS  negative psych ROS   GI/Hepatic negative GI ROS, Neg liver ROS,   Endo/Other  negative endocrine ROS  Renal/GU negative Renal ROS     Musculoskeletal   Abdominal   Peds  Hematology negative hematology ROS (+)   Anesthesia Other Findings   Reproductive/Obstetrics negative OB ROS                           Anesthesia Physical Anesthesia Plan  ASA: II  Anesthesia Plan: General LMA   Post-op Pain Management:    Induction:   Airway Management Planned:   Additional Equipment:   Intra-op Plan:   Post-operative Plan:   Informed Consent: I have reviewed the patients History and Physical, chart, labs and discussed the procedure including the risks, benefits and alternatives for the proposed anesthesia with the patient or authorized representative who has indicated his/her understanding and acceptance.   Dental Advisory Given  Plan Discussed with: CRNA, Surgeon and Anesthesiologist  Anesthesia Plan Comments:         Anesthesia Quick Evaluation

## 2012-06-02 NOTE — Transfer of Care (Signed)
Immediate Anesthesia Transfer of Care Note  Patient: [name]  Procedure(s) Performed: Procedure(s) (LRB) with comments: DILATATION AND CURETTAGE /HYSTEROSCOPY (N/A) INTRAUTERINE DEVICE (IUD) REMOVAL (N/A)  Patient Location: PACU  Anesthesia Type:MAC  Level of Consciousness: awake, alert  and oriented  Airway & Oxygen Therapy: Patient Spontanous Breathing and Patient connected to nasal cannula oxygen  Post-op Assessment: Report given to PACU RN, Patient moving all extremities and Patient moving all extremities X 4  Post vital signs: Reviewed and stable  Complications: No apparent anesthesia complications

## 2012-06-03 ENCOUNTER — Encounter (HOSPITAL_COMMUNITY): Payer: Self-pay | Admitting: Obstetrics and Gynecology

## 2012-07-22 ENCOUNTER — Ambulatory Visit (INDEPENDENT_AMBULATORY_CARE_PROVIDER_SITE_OTHER): Payer: 59 | Admitting: Family Medicine

## 2012-07-22 VITALS — BP 124/81 | HR 74 | Temp 98.3°F | Resp 16 | Ht 64.0 in | Wt 149.0 lb

## 2012-07-22 DIAGNOSIS — L210 Seborrhea capitis: Secondary | ICD-10-CM

## 2012-07-22 DIAGNOSIS — L219 Seborrheic dermatitis, unspecified: Secondary | ICD-10-CM

## 2012-07-22 MED ORDER — BETAMETHASONE DIPROPIONATE 0.05 % EX LOTN: TOPICAL_LOTION | Freq: Two times a day (BID) | CUTANEOUS | Status: DC

## 2012-07-22 MED ORDER — KETOCONAZOLE 2 % EX SHAM: MEDICATED_SHAMPOO | CUTANEOUS | Status: DC

## 2012-07-22 NOTE — Patient Instructions (Addendum)
Seborrheic Dermatitis Seborrheic dermatitis involves pink or red skin with greasy, flaky scales. This is often found on the scalp, eyebrows, nose, bearded area, and on or behind the ears. It can also occur on the central chest. It often occurs where there are more oil (sebaceous) glands. This condition is also known as dandruff. When this condition affects a baby's scalp, it is called cradle cap. It may come and go for no known reason. It can occur at any time of life from infancy to old age. CAUSES  The cause is unknown. It is not the result of too little moisture or too much oil. In some people, seborrheic dermatitis flare-ups seem to be triggered by stress. It also commonly occurs in people with certain diseases such as Parkinson's disease or HIV/AIDS. SYMPTOMS   Thick scales on the scalp.  Redness on the face or in the armpits.  The skin may seem oily or dry, but moisturizers do not help.  In infants, seborrheic dermatitis appears as scaly redness that does not seem to bother the baby. In some babies, it affects only the scalp. In others, it also affects the neck creases, armpits, groin, or behind the ears.  In adults and adolescents, seborrheic dermatitis may affect only the scalp. It may look patchy or spread out, with areas of redness and flaking. Other areas commonly affected include:  Eyebrows.  Eyelids.  Forehead.  Skin behind the ears.  Outer ears.  Chest.  Armpits.  Nose creases.  Skin creases under the breasts.  Skin between the buttocks.  Groin.  Some adults and adolescents feel itching or burning in the affected areas. DIAGNOSIS  Your caregiver can usually tell what the problem is by doing a physical exam. TREATMENT   Cortisone (steroid) ointments, creams, and lotions can help decrease inflammation.  Babies can be treated with baby oil to soften the scales, then they may be washed with baby shampoo. If this does not help, a prescription topical steroid  medicine may work.  Adults can use medicated shampoos.  Your caregiver may prescribe corticosteroid cream and shampoo containing an antifungal or yeast medicine (ketoconazole). Hydrocortisone or anti-yeast cream can be rubbed directly onto seborrheic dermatitis patches. Yeast does not cause seborrheic dermatitis, but it seems to add to the problem. In infants, seborrheic dermatitis is often worst during the first year of life. It tends to disappear on its own as the child grows. However, it may return during the teenage years. In adults and adolescents, seborrheic dermatitis tends to be a long-lasting condition that comes and goes over many years. HOME CARE INSTRUCTIONS   Use prescribed medicines as directed.  In infants, do not aggressively remove the scales or flakes on the scalp with a comb or by other means. This may lead to hair loss. SEEK MEDICAL CARE IF:   The problem does not improve from the medicated shampoos, lotions, or other medicines given by your caregiver.  You have any other questions or concerns. Document Released: 07/16/2005 Document Revised: 01/15/2012 Document Reviewed: 12/05/2009 [employer] Patient Information 2013 [employer].  

## 2012-07-22 NOTE — Progress Notes (Signed)
Is a 26 year old woman from [place] who comes in with her husband because of swollen glands behind her right ear. The patient has been on doxycycline for 4 days. She has no night sweats, cough, weight loss, or other adenopathy. She's not lost her appetite her feel sick in any way.  The patient does have chronic seborrheic dermatitis of her scalp and has been itching more lately. She notes that the seborrheic dermatitis is worse in the winter.  Objective: Marked scaling of scalp behind right ear and large patch involving the posterior parietal and occipital regions of her scalp  Mild adenopathy behind the right auricle.  TMs normal Oropharynx clear Neck supple without adenopathy patient is in no distress and is breathing normally.  Assessment: Seborrheic dermatitis  Plan: Stop the doxycycline 1. Seborrhea capitis  ketoconazole (NIZORAL) 2 % shampoo, betamethasone dipropionate 0.05 % lotion

## 2012-09-22 ENCOUNTER — Other Ambulatory Visit: Payer: Self-pay | Admitting: Obstetrics and Gynecology

## 2012-09-22 ENCOUNTER — Other Ambulatory Visit (HOSPITAL_COMMUNITY)
Admission: RE | Admit: 2012-09-22 | Discharge: 2012-09-22 | Disposition: A | Discharge status: Home / Self Care | Payer: 59 | Source: Ambulatory Visit | Attending: Obstetrics and Gynecology | Admitting: Obstetrics and Gynecology

## 2012-09-22 DIAGNOSIS — Z01419 Encounter for gynecological examination (general) (routine) without abnormal findings: Secondary | ICD-10-CM | POA: Insufficient documentation

## 2013-01-18 ENCOUNTER — Ambulatory Visit: Payer: 59

## 2013-01-18 ENCOUNTER — Ambulatory Visit (INDEPENDENT_AMBULATORY_CARE_PROVIDER_SITE_OTHER): Payer: 59 | Admitting: Internal Medicine

## 2013-01-18 VITALS — BP 100/67 | HR 87 | Temp 98.7°F | Resp 16 | Ht 64.0 in | Wt 151.6 lb

## 2013-01-18 DIAGNOSIS — M25539 Pain in unspecified wrist: Secondary | ICD-10-CM

## 2013-01-18 DIAGNOSIS — M25531 Pain in right wrist: Secondary | ICD-10-CM

## 2013-01-18 MED ORDER — IBUPROFEN 800 MG PO TABS: 800.0000 mg | ORAL_TABLET | Freq: Three times a day (TID) | ORAL | Status: DC | PRN

## 2013-01-18 NOTE — Progress Notes (Signed)
  Subjective:    Patient ID: [name], female    DOB: 31-May-1986, 27 y.o.   MRN: [id number]  HPI pain and right arm yesterday that was centered on the forearm but extended to the shoulder and upper arm at times/no known injury or swelling//seemed to hurt more when  still rather than with movement--- was bothersome last night but resolved with Tylenol until this morning 2 young children at home History of prior problems with this arm but not in many months    Review of Systems     Objective:   Physical Exam BP 100/67  Pulse 87  Temp(Src) 98.7 F (37.1 C) (Oral)  Resp 16  Ht 5\' 4"  (1.626 m)  Wt 151 lb 9.6 oz (68.765 kg)  BMI 26.01 kg/m2  SpO2 100% No acute distress Neck full range of motion Right shoulder full range of motion without pain Right upper arm clear  Right elbow full range of motion without pain Tender in the medial aspect of the right forearm/midbody without swelling or ecchymosis Supination pronation intact Wrists within normal limits  [facility] reading (PRIMARY) by  Dr. Merla Riches forearm normal         Assessment & Plan:

## 2013-07-30 NOTE — L&D Delivery Note (Addendum)
Final Labor Progress Note At 2300 pt reports an increased in rectal pressure.  VE C/C/+1 w/BBW.  Pt screaming in pain.   Vaginal Delivery Note The pt had IV pain medicine x 1 for pain management.  Artificial rupture of membranes today, at 2315, clear.  GBS was positive, Pt is allergic to PCN so she was given vancomycin x 1.  Cervical dilation was complete at  2313.  NICHD Category 1.    Pushing with guidance began at  2322.   After 19 minutes of pushing the head, shoulders and the body of a viable female infant "Ahmed" delivered spontaneously with maternal effort in the ROA position at 2341.   Tight Tolono x , unable to reduced, infant somersaulted thru without difficulty.   With vigorous tone and spontaneous cry, the infant was placed on moms abd.  After the umbilical cord was clamped it was cut by the FOB, then cord blood was obtained for evaluation.  Spontaneous delivery of a intact placenta with a 3 vessel cord via Shultz at  2347.   Episiotomy: None   The vulva, perineum, vaginal vault, rectum and cervix were inspected and revealed a superficial peri clitoral and periurethral laceration which was repaired using a 4-0 vicryl on a SH needle with 20cc of 1% lidocaine.   Postpartum pitocin as ordered.  Fundus firm, lochia minimum, bleeding under control. EBL 50, Pt hemodynamically stable.   Sponge, laps and needle count correct and verified with the primary care nurse.  Attending MD available at all times.    Mom and baby were left in stable condition, baby skin to skin. Routine postpartum orders   Mother unsure about method of contraception, maybe the Ring or Marina  Infant to have in patient circumcision   Placenta to pathology: NO     Cord Gases sent to lab: NO Cord blood sent to lab: YES   APGARS:  8 at 1 minute and 9 at 5 minutes Weight:. 6lbs 14.4oz     [name], CNM, MSN 07/26/2014. 12:29 AM

## 2013-08-24 ENCOUNTER — Ambulatory Visit (INDEPENDENT_AMBULATORY_CARE_PROVIDER_SITE_OTHER): Payer: 59 | Admitting: Family Medicine

## 2013-08-24 VITALS — BP 122/74 | HR 104 | Temp 98.9°F | Resp 17 | Ht 64.5 in | Wt 153.0 lb

## 2013-08-24 DIAGNOSIS — H9209 Otalgia, unspecified ear: Secondary | ICD-10-CM

## 2013-08-24 DIAGNOSIS — R6883 Chills (without fever): Secondary | ICD-10-CM

## 2013-08-24 DIAGNOSIS — J029 Acute pharyngitis, unspecified: Secondary | ICD-10-CM

## 2013-08-24 DIAGNOSIS — J019 Acute sinusitis, unspecified: Secondary | ICD-10-CM

## 2013-08-24 DIAGNOSIS — H9203 Otalgia, bilateral: Secondary | ICD-10-CM

## 2013-08-24 LAB — POCT INFLUENZA A/B
Influenza A, POC: NEGATIVE
Influenza B, POC: NEGATIVE

## 2013-08-24 LAB — POCT RAPID STREP A (OFFICE): Rapid Strep A Screen: NEGATIVE

## 2013-08-24 MED ORDER — AZITHROMYCIN 250 MG PO TABS: ORAL_TABLET | ORAL | Status: DC

## 2013-08-24 NOTE — Progress Notes (Signed)
Chief Complaint:  Chief Complaint  Patient presents with  . Sore Throat  . Headache    HPI:  [name] is a 28 y.o. female who is here for sore throat, chill, weakness, right ear pain and headache slight cough x 1 day.  Has taken tylenol, no fever but has chills. She has a 26 year old and also a 26 years old, and they have been sick, with ear and eye infection She did not get flu vaccine, She recently came back here from [place] 4 days ago. No fevers.  Denies nausea vomiting skin rash abd pain or msk aches or diarrhea. No sick contacts No bleeding, has not tried anything for this. No otc meds.    Past Medical History  Diagnosis Date  . Headache(784.0)   . Allergy   . Asthma    Past Surgical History  Procedure Laterality Date  . No past surgeries    . Hysteroscopy w/d&c  06/02/2012    Procedure: DILATATION AND CURETTAGE /HYSTEROSCOPY;  Surgeon: Dorien Chihuahua. Richardson Dopp, MD;  Location: [facility];  Service: Gynecology;  Laterality: N/A;  . Iud removal  06/02/2012    Procedure: INTRAUTERINE DEVICE (IUD) REMOVAL;  Surgeon: Dorien Chihuahua. Richardson Dopp, MD;  Location: [facility];  Service: Gynecology;  Laterality: N/A;  . Iud removal      October 2013   History   Social History  . Marital Status: Married    Spouse Name: N/A    Number of Children: N/A  . Years of Education: N/A   Social History Main Topics  . Smoking status: Never Smoker   . Smokeless tobacco: None  . Alcohol Use: No  . Drug Use: No  . Sexual Activity: Yes    Birth Control/ Protection: None   Other Topics Concern  . None   Social History Narrative  . None   No family history on file. Allergies  Allergen Reactions  . Penicillins Cross Reactors Shortness Of Breath   Prior to Admission medications   Medication Sig Start Date End Date Taking? Authorizing Provider  etonogestrel-ethinyl estradiol (NUVARING) 0.12-0.015 MG/24HR vaginal ring Place 1 each vaginally every 28 (twenty-eight) days. Insert vaginally and leave in place  for 3 consecutive weeks, then remove for 1 week.   Yes Historical Provider, MD  ibuprofen (ADVIL,MOTRIN) 800 MG tablet Take 1 tablet (800 mg total) by mouth every 8 (eight) hours as needed for pain. 01/18/13  Yes Tonye Pearson, MD     ROS: The patient denies fevers,  night sweats, unintentional weight loss, chest pain, palpitations, wheezing, dyspnea on exertion, nausea, vomiting, abdominal pain, dysuria, hematuria, melena, numbness, weakness, or tingling.   All other systems have been reviewed and were otherwise negative with the exception of those mentioned in the HPI and as above.    PHYSICAL EXAM: Filed Vitals:   08/24/13 1348  BP: 122/74  Pulse: 104  Temp: 98.9 F (37.2 C)  Resp: 17   Filed Vitals:   08/24/13 1348  Height: 5' 4.5" (1.638 m)  Weight: 153 lb (69.4 kg)   Body mass index is 25.87 kg/(m^2).  General: Alert, no acute distress HEENT:  Normocephalic, atraumatic, oropharynx patent. EOMI, PERRLA, TM nl, + maxillary sinus tenderness Cardiovascular:  Regular rate and rhythm, no rubs murmurs or gallops.  No Carotid bruits, radial pulse intact. No pedal edema.  Respiratory: Clear to auscultation bilaterally.  No wheezes, rales, or rhonchi.  No cyanosis, no use of accessory musculature GI: No organomegaly,  abdomen is soft and non-tender, positive bowel sounds.  No masses. Skin: No rashes. Neurologic: Facial musculature symmetric. Psychiatric: Patient is appropriate throughout our interaction. Lymphatic: No cervical lymphadenopathy Musculoskeletal: Gait intact.   LABS: Results for orders placed in visit on 08/24/13  POCT RAPID STREP A (OFFICE)      Result Value Range   Rapid Strep A Screen Negative  Negative  POCT INFLUENZA A/B      Result Value Range   Influenza A, POC Negative     Influenza B, POC Negative       EKG/XRAY:   [facility]   ASSESSMENT/PLAN: Encounter Diagnoses  Name Primary?  . Sore throat   . Chills  (without fever)   . Acute sinusitis Yes  . Otalgia of both ears    Rx Azithromycin Rx Flonase Rx otc Aleve, cepachol samples given Advise precautions for worsening sxs, she was recently on long plane ride but minimal risk factors. F/u prn  Gross sideeffects, risk and benefits, and alternatives of medications d/w patient. Patient is aware that all medications have potential sideeffects and we are unable to predict every sideeffect or drug-drug interaction that may occur.  Hamilton CapriLE, THAO PHUONG, DO 08/24/2013 2:43 PM

## 2013-08-24 NOTE — Patient Instructions (Signed)

## 2013-08-26 LAB — CULTURE, GROUP A STREP: Organism ID, Bacteria: NORMAL

## 2013-12-07 ENCOUNTER — Other Ambulatory Visit: Payer: Self-pay

## 2013-12-07 LAB — OB RESULTS CONSOLE ABO/RH: RH TYPE: POSITIVE

## 2013-12-07 LAB — OB RESULTS CONSOLE RUBELLA ANTIBODY, IGM: Rubella: IMMUNE

## 2013-12-07 LAB — OB RESULTS CONSOLE HIV ANTIBODY (ROUTINE TESTING): HIV: NONREACTIVE

## 2013-12-07 LAB — OB RESULTS CONSOLE RPR: RPR: NONREACTIVE

## 2013-12-07 LAB — OB RESULTS CONSOLE GC/CHLAMYDIA: Chlamydia: NEGATIVE

## 2013-12-07 LAB — OB RESULTS CONSOLE HEPATITIS B SURFACE ANTIGEN: HEP B S AG: NEGATIVE

## 2013-12-07 LAB — OB RESULTS CONSOLE ANTIBODY SCREEN: ANTIBODY SCREEN: NEGATIVE

## 2013-12-10 ENCOUNTER — Other Ambulatory Visit (HOSPITAL_COMMUNITY)
Admission: RE | Admit: 2013-12-10 | Discharge: 2013-12-10 | Disposition: A | Discharge status: Home / Self Care | Payer: 59 | Source: Ambulatory Visit | Attending: Obstetrics and Gynecology | Admitting: Obstetrics and Gynecology

## 2013-12-10 ENCOUNTER — Other Ambulatory Visit: Payer: Self-pay | Admitting: Obstetrics and Gynecology

## 2013-12-10 DIAGNOSIS — Z113 Encounter for screening for infections with a predominantly sexual mode of transmission: Secondary | ICD-10-CM | POA: Insufficient documentation

## 2013-12-10 DIAGNOSIS — Z01419 Encounter for gynecological examination (general) (routine) without abnormal findings: Secondary | ICD-10-CM | POA: Insufficient documentation

## 2014-01-15 ENCOUNTER — Inpatient Hospital Stay (HOSPITAL_COMMUNITY)
Admission: AD | Admit: 2014-01-15 | Discharge: 2014-01-15 | Disposition: A | Discharge status: Home / Self Care | Payer: 59 | Source: Ambulatory Visit | Attending: Obstetrics and Gynecology | Admitting: Obstetrics and Gynecology

## 2014-01-15 ENCOUNTER — Encounter (HOSPITAL_COMMUNITY): Payer: Self-pay | Admitting: *Deleted

## 2014-01-15 DIAGNOSIS — O209 Hemorrhage in early pregnancy, unspecified: Secondary | ICD-10-CM | POA: Insufficient documentation

## 2014-01-15 DIAGNOSIS — O4692 Antepartum hemorrhage, unspecified, second trimester: Secondary | ICD-10-CM

## 2014-01-15 DIAGNOSIS — R109 Unspecified abdominal pain: Secondary | ICD-10-CM | POA: Insufficient documentation

## 2014-01-15 DIAGNOSIS — O4690 Antepartum hemorrhage, unspecified, unspecified trimester: Secondary | ICD-10-CM

## 2014-01-15 LAB — URINALYSIS, ROUTINE W REFLEX MICROSCOPIC
BILIRUBIN URINE: NEGATIVE
Glucose, UA: NEGATIVE mg/dL
KETONES UR: NEGATIVE mg/dL
LEUKOCYTES UA: NEGATIVE
NITRITE: NEGATIVE
Protein, ur: NEGATIVE mg/dL
UROBILINOGEN UA: 0.2 mg/dL (ref 0.0–1.0)
pH: 5.5 (ref 5.0–8.0)

## 2014-01-15 LAB — URINE MICROSCOPIC-ADD ON

## 2014-01-15 NOTE — Discharge Instructions (Signed)
Pelvic Rest Pelvic rest is sometimes recommended for women when:   The placenta is partially or completely covering the opening of the cervix (placenta previa).  There is bleeding between the uterine wall and the amniotic sac in the first trimester (subchorionic hemorrhage).  The cervix begins to open without labor starting (incompetent cervix, cervical insufficiency).  The labor is too early (preterm labor). HOME CARE INSTRUCTIONS  Do not have sexual intercourse, stimulation, or an orgasm.  Do not use tampons, douche, or put anything in the vagina.  Do not lift anything over 10 pounds (4.5 kg).  Avoid strenuous activity or straining your pelvic muscles. SEEK MEDICAL CARE IF:  You have any vaginal bleeding during pregnancy. Treat this as a potential emergency.  You have cramping pain felt low in the stomach (stronger than menstrual cramps).  You notice vaginal discharge (watery, mucus, or bloody).  You have a low, dull backache.  There are regular contractions or uterine tightening. SEEK IMMEDIATE MEDICAL CARE IF: You have vaginal bleeding and have placenta previa.  Document Released: 11/10/2010 Document Revised: 10/08/2011 Document Reviewed: 11/10/2010 [facility] Patient Information 2015 [place]Care, [place]. This information is not intended to replace advice given to you by your health care provider. Make sure you discuss any questions you have with your health care provider.  Vaginal Bleeding During Pregnancy A small amount of bleeding (spotting) from the vagina is relatively common in early pregnancy. It usually stops on its own. Various things may cause bleeding or spotting in early pregnancy. Some bleeding may be related to the pregnancy, and some may not. In most cases, the bleeding is normal and is not a problem. However, bleeding can also be a sign of something serious. Be sure to tell your health care provider about any vaginal bleeding right away. Some possible causes of  vaginal bleeding during the first trimester include:  Infection or inflammation of the cervix.  Growths (polyps) on the cervix.  Miscarriage or threatened miscarriage.  Pregnancy tissue has developed outside of the uterus and in a fallopian tube (tubal pregnancy).  Tiny cysts have developed in the uterus instead of pregnancy tissue (molar pregnancy). HOME CARE INSTRUCTIONS  Watch your condition for any changes. The following actions may help to lessen any discomfort you are feeling:  Follow your health care provider's instructions for limiting your activity. If your health care provider orders bed rest, you may need to stay in bed and only get up to use the bathroom. However, your health care provider may allow you to continue light activity.  If needed, make plans for someone to help with your regular activities and responsibilities while you are on bed rest.  Keep track of the number of pads you use each day, how often you change pads, and how soaked (saturated) they are. Write this down.  Do not use tampons. Do not douche.  Do not have sexual intercourse or orgasms until approved by your health care provider.  If you pass any tissue from your vagina, save the tissue so you can show it to your health care provider.  Only take over-the-counter or prescription medicines as directed by your health care provider.  Do not take aspirin because it can make you bleed.  Keep all follow-up appointments as directed by your health care provider. SEEK MEDICAL CARE IF:  You have any vaginal bleeding during any part of your pregnancy.  You have cramps or labor pains.  You have a fever, not controlled by medicine. SEEK IMMEDIATE MEDICAL CARE  IF:   You have severe cramps in your back or belly (abdomen).  You pass large clots or tissue from your vagina.  Your bleeding increases.  You feel light-headed or weak, or you have fainting episodes.  You have chills.  You are leaking fluid  or have a gush of fluid from your vagina.  You pass out while having a bowel movement. MAKE SURE YOU:  Understand these instructions.  Will watch your condition.  Will get help right away if you are not doing well or get worse. Document Released: 04/25/2005 Document Revised: 07/21/2013 Document Reviewed: 03/23/2013 [facility]Exit[facility] Patient Information 2015 [place]Exit[place], [place]LL[place]. This information is not intended to replace advice given to you by your health care provider. Make sure you discuss any questions you have with your health care provider.

## 2014-01-15 NOTE — MAU Provider Note (Signed)
History     CSN: [id number]  Arrival date and time: 01/15/14 [id number]   First Provider Initiated Contact with Patient 01/15/14 [id number]        Chief Complaint  Patient presents with  . Vaginal Bleeding  . Abdominal Pain   HPI Ms. [name] is a 28 y.o. G3P2002 at 4783w6d who presents to MAU today with sudden onset vaginal bleeding around 0100 today. She states occasional mild cramps prior to bleeding that became worse today. She denies abnormal discharge, N/V/D, fever, UTI symptoms or previous episodes of bleeding during this pregnancy. She states last intercourse was Monday. She denies complications with this or previous pregnancies.   OB History   Grav Para Term Preterm Abortions TAB SAB Ect Mult Living   3 2 2       2       Past Medical History  Diagnosis Date  . Headache(784.0)   . Allergy   . Asthma     Past Surgical History  Procedure Laterality Date  . No past surgeries    . Hysteroscopy w/d&c  06/02/2012    Procedure: DILATATION AND CURETTAGE /HYSTEROSCOPY;  Surgeon: Dorien Chihuahuaara J. Richardson Doppole, MD;  Location: [facility];  Service: Gynecology;  Laterality: N/A;  . Iud removal  06/02/2012    Procedure: INTRAUTERINE DEVICE (IUD) REMOVAL;  Surgeon: Dorien Chihuahuaara J. Richardson Doppole, MD;  Location: [facility];  Service: Gynecology;  Laterality: N/A;  . Iud removal      October 2013    History reviewed. No pertinent family history.  History  Substance Use Topics  . Smoking status: Never Smoker   . Smokeless tobacco: Not on file  . Alcohol Use: No    Allergies:  Allergies  Allergen Reactions  . Penicillins Cross Reactors Shortness Of Breath    No prescriptions prior to admission    Review of Systems  Constitutional: Negative for fever and malaise/fatigue.  Gastrointestinal: Positive for abdominal pain. Negative for nausea, vomiting, diarrhea and constipation.  Genitourinary: Negative for dysuria, urgency and frequency.       Neg - vaginal discharge + vaginal bleeding   Physical Exam   Blood  pressure 100/60, pulse 85, temperature 98.4 F (36.9 C), temperature source Oral, resp. rate 18, height 5\' 4"  (1.626 m), weight 147 lb (66.679 kg), last menstrual period 07/26/2013, SpO2 100.00%.  Physical Exam  Constitutional: She is oriented to person, place, and time. She appears well-developed and well-nourished. No distress.  HENT:  Head: Normocephalic and atraumatic.  Cardiovascular: Normal rate.   Respiratory: Effort normal.  GI: Soft. She exhibits no distension and no mass. There is no tenderness. There is no rebound and no guarding.  Genitourinary: Uterus is enlarged (appropriate for GA). Uterus is not tender. Cervix exhibits no motion tenderness, no discharge and no friability. There is bleeding (scant dark brown blood in the vaginal vault) around the vagina. Vaginal discharge (scant thin, white-brown discharge noted) found.  Neurological: She is alert and oriented to person, place, and time.  Skin: Skin is warm and dry. No erythema.  Psychiatric: She has a normal mood and affect.  Dilation: Closed Exam by:: JULIE, PA  Results for orders placed during the hospital encounter of 01/15/14 (from the past 24 hour(s))  URINALYSIS, ROUTINE W REFLEX MICROSCOPIC     Status: Abnormal   Collection Time    01/15/14  2:32 AM      Result Value Ref Range   Color, Urine YELLOW  YELLOW   APPearance CLEAR  CLEAR   Specific  Gravity, Urine >1.030 (*) 1.005 - 1.030   pH 5.5  5.0 - 8.0   Glucose, UA NEGATIVE  NEGATIVE mg/dL   Hgb urine dipstick SMALL (*) NEGATIVE   Bilirubin Urine NEGATIVE  NEGATIVE   Ketones, ur NEGATIVE  NEGATIVE mg/dL   Protein, ur NEGATIVE  NEGATIVE mg/dL   Urobilinogen, UA 0.2  0.0 - 1.0 mg/dL   Nitrite NEGATIVE  NEGATIVE   Leukocytes, UA NEGATIVE  NEGATIVE  URINE MICROSCOPIC-ADD ON     Status: Abnormal   Collection Time    01/15/14  2:32 AM      Result Value Ref Range   Squamous Epithelial / LPF FEW (*) RARE   WBC, UA 0-2  <3 WBC/hpf   RBC / HPF 0-2  <3 RBC/hpf    Bacteria, UA FEW (*) RARE   Crystals CA OXALATE CRYSTALS (*) NEGATIVE      MAU Course  Procedures None  MDM FHR - 162 bpm with doppler Discussed with Dr. Stefano GaulStringer. Ok for discharge home with bleeding precautions  Assessment and Plan  A: SIUP at 5748w6d Vaginal bleeding in pregnancy  P: Discharge home Bleeding precautions discussed Pelvic rest advised Patient advised to follow-up with Dr. Richardson Doppole as scheduled or sooner if symptoms persist or worsen Patient may return to MAU as needed or if her condition were to change or worsen  Freddi StarrJulie N Ethier, PA-C  01/15/2014, 3:35 AM

## 2014-01-15 NOTE — MAU Note (Signed)
Pt reports bleeding and cramping tonight.

## 2014-01-15 NOTE — MAU Note (Signed)
PT  SAYS  SHE WENT TO BED AT 8PM-  THEN AWOKE AT 0100-- SAW BLOOD- BROWNISH- RED IN PANTY LINER.-  THEN WENT TO B-ROOM -  SAW BROWNISH BLOOD  WHEN WIPED.    IN RM 7-  NOTHING ON PERINEUM  OR PAD.  CRAMPING STARTED   WED.  TODAY CRAMPS  ARE  WORSE.  LAST SEX-  Monday.   GETS [facility]   WITH DR COLE-  LAST SEEN-   5 WEEKS AGO-  ALL OK.  HAS APPOINTMENT   THIS AM AT 10 AM.

## 2014-01-16 ENCOUNTER — Inpatient Hospital Stay (HOSPITAL_COMMUNITY): Payer: 59

## 2014-01-16 ENCOUNTER — Inpatient Hospital Stay (HOSPITAL_COMMUNITY)
Admission: AD | Admit: 2014-01-16 | Discharge: 2014-01-16 | Disposition: A | Discharge status: Home / Self Care | Payer: 59 | Source: Ambulatory Visit | Attending: Obstetrics and Gynecology | Admitting: Obstetrics and Gynecology

## 2014-01-16 DIAGNOSIS — O209 Hemorrhage in early pregnancy, unspecified: Secondary | ICD-10-CM | POA: Insufficient documentation

## 2014-01-16 DIAGNOSIS — O4692 Antepartum hemorrhage, unspecified, second trimester: Secondary | ICD-10-CM

## 2014-01-16 DIAGNOSIS — O4690 Antepartum hemorrhage, unspecified, unspecified trimester: Secondary | ICD-10-CM

## 2014-01-16 NOTE — Progress Notes (Signed)
Julie Ethier PA in earlier to discuss test results and d/c plan. Written and verbal d/c instructions given and understanding voiced. 

## 2014-01-16 NOTE — Discharge Instructions (Signed)
Pelvic Rest Pelvic rest is sometimes recommended for women when:   The placenta is partially or completely covering the opening of the cervix (placenta previa).  There is bleeding between the uterine wall and the amniotic sac in the first trimester (subchorionic hemorrhage).  The cervix begins to open without labor starting (incompetent cervix, cervical insufficiency).  The labor is too early (preterm labor). HOME CARE INSTRUCTIONS  Do not have sexual intercourse, stimulation, or an orgasm.  Do not use tampons, douche, or put anything in the vagina.  Do not lift anything over 10 pounds (4.5 kg).  Avoid strenuous activity or straining your pelvic muscles. SEEK MEDICAL CARE IF:  You have any vaginal bleeding during pregnancy. Treat this as a potential emergency.  You have cramping pain felt low in the stomach (stronger than menstrual cramps).  You notice vaginal discharge (watery, mucus, or bloody).  You have a low, dull backache.  There are regular contractions or uterine tightening. SEEK IMMEDIATE MEDICAL CARE IF: You have vaginal bleeding and have placenta previa.  Document Released: 11/10/2010 Document Revised: 10/08/2011 Document Reviewed: 11/10/2010 [facility]ExitCare Patient Information 2015 [place]ExitCare, [place]LLC. This information is not intended to replace advice given to you by your health care provider. Make sure you discuss any questions you have with your health care provider.  Vaginal Bleeding During Pregnancy, Second Trimester A small amount of bleeding (spotting) from the vagina is relatively common in pregnancy. It usually stops on its own. Various things can cause bleeding or spotting in pregnancy. Some bleeding may be related to the pregnancy, and some may not. Sometimes the bleeding is normal and is not a problem. However, bleeding can also be a sign of something serious. Be sure to tell your health care provider about any vaginal bleeding right away. Some possible causes of  vaginal bleeding during the second trimester include:  Infection, inflammation, or growths on the cervix.   The placenta may be partially or completely covering the opening of the cervix inside the uterus (placenta previa).  The placenta may have separated from the uterus (abruption of the placenta).   You may be having early (preterm) labor.   The cervix may not be strong enough to keep a baby inside the uterus (cervical insufficiency).   Tiny cysts may have developed in the uterus instead of pregnancy tissue (molar pregnancy). HOME CARE INSTRUCTIONS  Watch your condition for any changes. The following actions may help to lessen any discomfort you are feeling:  Follow your health care provider's instructions for limiting your activity. If your health care provider orders bed rest, you may need to stay in bed and only get up to use the bathroom. However, your health care provider may allow you to continue light activity.  If needed, make plans for someone to help with your regular activities and responsibilities while you are on bed rest.  Keep track of the number of pads you use each day, how often you change pads, and how soaked (saturated) they are. Write this down.  Do not use tampons. Do not douche.  Do not have sexual intercourse or orgasms until approved by your health care provider.  If you pass any tissue from your vagina, save the tissue so you can show it to your health care provider.  Only take over-the-counter or prescription medicines as directed by your health care provider.  Do not take aspirin because it can make you bleed.  Do not exercise or perform any strenuous activities or heavy lifting without your  health care provider's permission.  Keep all follow-up appointments as directed by your health care provider. SEEK MEDICAL CARE IF:  You have any vaginal bleeding during any part of your pregnancy.  You have cramps or labor pains.  You have a fever, not  controlled by medicine. SEEK IMMEDIATE MEDICAL CARE IF:   You have severe cramps in your back or belly (abdomen).  You have contractions.  You have chills.  You pass large clots or tissue from your vagina.  Your bleeding increases.  You feel light-headed or weak, or you have fainting episodes.  You are leaking fluid or have a gush of fluid from your vagina. MAKE SURE YOU:  Understand these instructions.  Will watch your condition.  Will get help right away if you are not doing well or get worse. Document Released: 04/25/2005 Document Revised: 07/21/2013 Document Reviewed: 03/23/2013 [facility]Exit[facility] Patient Information 2015 [place]ExitCare, [place]LLC. This information is not intended to replace advice given to you by your health care provider. Make sure you discuss any questions you have with your health care provider.

## 2014-01-16 NOTE — MAU Note (Signed)
Pt reports she is bleeding heavier today than yesterday. Went to MD office today and bleeding was not heavy then. Pt reports she is having some stabbing pain off and on.

## 2014-01-16 NOTE — MAU Provider Note (Signed)
History     CSN: [id number]  Arrival date and time: 01/16/14 0224   First Provider Initiated Contact with Patient 01/16/14 0257      Chief Complaint  Patient presents with  . Vaginal Bleeding   HPI Ms. [name] is a 28 y.o. G3P2002 at [redacted]w[redacted]d who presents to MAU today with complaint of vaginal bleeding. The patient states that she reached to pick something up tonight and had red bleeding that soaked her underwear. The patient was seen last night for bleeding and again today in the office. US had been scheduled for Monday. The patient denies abdominal pain, just occasional LLQ pressure.   OB History   Grav Para Term Preterm Abortions TAB SAB Ect Mult Living   3 2 2       2       Past Medical History  Diagnosis Date  . Headache(784.0)   . Allergy   . Asthma     Past Surgical History  Procedure Laterality Date  . No past surgeries    . Hysteroscopy w/d&c  06/02/2012    Procedure: DILATATION AND CURETTAGE /HYSTEROSCOPY;  Surgeon: Dorien Chihuahuaara J. Richardson Doppole, MD;  Location: [facility];  Service: Gynecology;  Laterality: N/A;  . Iud removal  06/02/2012    Procedure: INTRAUTERINE DEVICE (IUD) REMOVAL;  Surgeon: Dorien Chihuahuaara J. Richardson Doppole, MD;  Location: [facility];  Service: Gynecology;  Laterality: N/A;  . Iud removal      October 2013    No family history on file.  History  Substance Use Topics  . Smoking status: Never Smoker   . Smokeless tobacco: Not on file  . Alcohol Use: No    Allergies:  Allergies  Allergen Reactions  . Penicillins Cross Reactors Shortness Of Breath    No prescriptions prior to admission    Review of Systems  Constitutional: Negative for fever.  Gastrointestinal: Negative for abdominal pain.  Genitourinary:       + vaginal bleeding   Physical Exam   Blood pressure 98/56, pulse 81, temperature 98.2 F (36.8 C), temperature source Oral, resp. rate 18, last menstrual period 07/26/2013.  Physical Exam  Constitutional: She is oriented to person, place, and time. She  appears well-developed and well-nourished. No distress.  HENT:  Head: Normocephalic and atraumatic.  Cardiovascular: Normal rate.   Respiratory: Effort normal.  GI: Soft. She exhibits no distension and no mass. There is no tenderness. There is no rebound and no guarding.  Genitourinary: There is bleeding (scant dark brown blood in the vaginal vault) around the vagina. No vaginal discharge found.  Cervix: closed/thick  Neurological: She is alert and oriented to person, place, and time.  Skin: Skin is warm and dry. No erythema.  Psychiatric: She has a normal mood and affect.   [place] Ob Comp Less 14 Wks  01/16/2014   CLINICAL DATA:  Vaginal bleeding and pelvic pain.  EXAM: OBSTETRIC <14 WK ULTRASOUND  TECHNIQUE: Transabdominal ultrasound was performed for evaluation of the gestation as well as the maternal uterus and adnexal regions.  COMPARISON:  None.  FINDINGS: Intrauterine gestational sac: Visualized/normal in shape.  Yolk sac:  No  Embryo:  Yes  Cardiac Activity: Yes  Heart Rate: 169 bpm  CRL:   6.68 cm   13 w 0 d                  US EDC: 07/24/2014  Maternal uterus/adnexae: No subchorionic hemorrhage is seen. The uterus is otherwise unremarkable in appearance.  The ovaries are  within normal limits. The right ovary measures 2.9 x 1.8 x 1.7 cm, while the left ovary measures 2.5 x 1.1 x 1.8 cm. No suspicious adnexal masses are seen; there is no evidence for ovarian torsion.  No free fluid is seen within the pelvic cul-de-sac.  IMPRESSION: Single live intrauterine pregnancy noted, with a crown-rump length of 6.7 cm, corresponding to a gestational age of [redacted] weeks 0 days. This reflects an estimated date of delivery of July 24, 2014.   Electronically Signed   By: Roanna RaiderJeffery  Chang M.D.   On: 01/16/2014 04:37     MAU Course  Procedures None  MDM +FHR - 160 bpm with doppler Discussed with Dr. Richardson Doppole. Order US today.  Assessment and Plan  A: SIUP at 6311w0d Vaginal bleeding in pregnancy, second  trimester  P: Discharge home Bleeding precautions discussed Pelvic rest advised Patient advised to follow-up as scheduled with Dr. Richardson Doppole for routine prenatal care Patient may return to MAU as needed or if her condition were to change or worsen   Freddi StarrJulie N Ethier, PA-C  01/16/2014, 4:43 AM

## 2014-02-15 ENCOUNTER — Other Ambulatory Visit (HOSPITAL_COMMUNITY): Payer: Self-pay | Admitting: Obstetrics and Gynecology

## 2014-02-15 DIAGNOSIS — Z3689 Encounter for other specified antenatal screening: Secondary | ICD-10-CM

## 2014-02-15 DIAGNOSIS — O283 Abnormal ultrasonic finding on antenatal screening of mother: Secondary | ICD-10-CM

## 2014-02-24 ENCOUNTER — Other Ambulatory Visit (HOSPITAL_COMMUNITY): Payer: Self-pay | Admitting: Obstetrics and Gynecology

## 2014-02-24 ENCOUNTER — Other Ambulatory Visit: Payer: Self-pay

## 2014-02-24 ENCOUNTER — Ambulatory Visit (HOSPITAL_COMMUNITY)
Admission: RE | Admit: 2014-02-24 | Discharge: 2014-02-24 | Disposition: A | Discharge status: Home / Self Care | Payer: 59 | Source: Ambulatory Visit | Attending: Obstetrics and Gynecology | Admitting: Obstetrics and Gynecology

## 2014-02-24 ENCOUNTER — Encounter (HOSPITAL_COMMUNITY): Payer: Self-pay

## 2014-02-24 ENCOUNTER — Encounter (HOSPITAL_COMMUNITY): Payer: 59

## 2014-02-24 ENCOUNTER — Encounter (HOSPITAL_COMMUNITY): Payer: Self-pay | Admitting: Obstetrics and Gynecology

## 2014-02-24 DIAGNOSIS — O289 Unspecified abnormal findings on antenatal screening of mother: Secondary | ICD-10-CM | POA: Diagnosis not present

## 2014-02-24 DIAGNOSIS — Z3689 Encounter for other specified antenatal screening: Secondary | ICD-10-CM

## 2014-02-24 DIAGNOSIS — IMO0002 Reserved for concepts with insufficient information to code with codable children: Secondary | ICD-10-CM | POA: Diagnosis present

## 2014-02-24 DIAGNOSIS — O358XX0 Maternal care for other (suspected) fetal abnormality and damage, not applicable or unspecified: Secondary | ICD-10-CM | POA: Diagnosis not present

## 2014-02-24 DIAGNOSIS — O283 Abnormal ultrasonic finding on antenatal screening of mother: Secondary | ICD-10-CM

## 2014-02-24 LAB — ROUTINE CHROMOSOME - KARYOTYPE

## 2014-02-24 LAB — AP-AFP (ALPHA FETOPROTEIN)

## 2014-02-24 NOTE — Progress Notes (Signed)
Genetic Counseling  High-Risk Gestation Note  Appointment Date:  02/24/2014 Referred By: Kathleen Brow., MD Date of Birth:  [birth year] Partner: [name]. [place]    Pregnancy History: [id number] Estimated Date of Delivery: 07/24/14 Estimated Gestational Age: 97w4dAttending: DViann Fish MD   I met with [name], [name] for genetic counseling because of abnormal ultrasound findings.  A [employer] interpreter translated during the genetic counseling appointment today.  Ms. Kathleen Brunswickwas sent for ultrasound and consultation today regarding the suspicion [place] .  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  The remainder of the fetal anatomy was well visualized; there were no other anomalies or markers suggestive of fetal aneuploidy or specific genetic condition.  The report will be documented separately.   We discussed that clubfoot/feet is a term that actually describes three distinct anomalies (talipes equinovarus, talipes calcaneovalgus, and metatarsus varus) and occurs in 1 in 1000 births.  The most common type of clubfeet, talipes equinovarus, is characterized by forefoot adduction with supination, heel varus, and ankle equines, which cannot be brought back to a neutral position.  They were counseled that clubfeet can be an isolated difference, occur as a feature of an underlying syndrome, or the result of neurological impairment.  We discussed that the most likely mode of inheritance for nonsyndromic, isolated clubfoot/feet is multifactorial.  There is a known genetic component for multifactorial clubfoot, as demonstrated by twin studies; environmental factors also play a role, including infection, drugs, and intrauterine environment (oligohydramnios, fetal positioning).  While the majority of cases of clubfoot/feet are isolated, it is a feature in more than 200 known  genetic syndromes, including both chromosomal and single gene conditions.  We reviewed chromosomes, nondisjunction and the features of Down syndrome, trisomy 142 and 131  We discussed that the risk for other chromosome aberrations is slightly increased (microdeletions, microduplications, insertions, translocations).  We reviewed single gene conditions including common inheritance patterns and associated risks for recurrence.    This couple had Harmony testing through [employer]  We reviewed the normal results and the associated reduction in risks for trisomy 251(Down syndrome), trisomy 115 and trisomy 175  They were counseled that although highly specific and sensitive, this testing is not considered to be diagnostic and does not detect all chromosome aberrations.  We reviewed the detection and false positive rates of Harmony.  We then discussed the option of amniocentesis, including the limitations, benefits, and risks.  They understand that amniocentesis allows evaluation of the fetal chromosomes, but cannot detect all genetic conditions.  Specifically, we discussed that single gene conditions are difficult to diagnose prenatally unless a specific condition is suspected based on additional ultrasound findings or family history. Additionally, we discussed the availability of microarray analysis, which can be performed pre and postnatally.  They were counseled that microarray analysis is a molecular based technique in which a test sample of DNA (fetal) is compared to a reference (normal) genome in order to determine if the test sample has any extra or missing genetic information.  Microarray analysis allows for the detection of genetic deletions and duplications that are 1825times smaller than those identified by routine chromosome analysis.  We discussed that recent publications show that approximately 6% of patients with an abnormal fetal ultrasound and a normal fetal karyotype had a significant  microdeletion/microduplication detected by prenatal microarray analysis.   After thoughtful consideration, this couple elected  to have amniocentesis.  Given that they had normal Harmony results, they declined FISH analysis.  They understand that given the normal Harmony result, the risk of a fetal chromosome abnormality is significantly reduced.  They wish to defer microarray analysis until after the karyotype results are available for review.  We discussed the option of meeting with a pediatric orthopedic specialist to discuss expectant management and treatment of clubfeet.  They would like to pursue treatment for their child at [facility].  We will schedule a prenatal consult with orthopedics during the third trimester of pregnancy.  Both family histories were reviewed in detail.  This couple reported that they are first cousins, their fathers are brothers.  We discussed that children born to a consanguineous couple are at increased risk for genetic health problems.  This increase in risk is related to the possibility of passing on recessive genes. We explained that every person carries approximately 7-10 non-working genes that when received in a double dose results in recessive genetic conditions.  In general, unrelated couples have a relatively low risk of having a child with a recessive condition because the likelihood of both parents carrying the same non-working recessive gene is very low.  However, when a couple is related, they have inherited some of their genetic information from the same family member, which leads to an increased chance that they may carry the same recessive gene and have a child with a recessive condition.  For first cousin unions, the risk to have a child with a birth defect, mental retardation, or genetic condition is increased approximately 2-4% above the general population risk (3-5%).  We reviewed recessive inheritance in detail.  We discussed that although a  recessive condition is not recognized at this time to be responsible for the ultrasound findings, it is possible that the clubfeet are the result of some underlying recessive condition.  They understand that amniocentesis cannot rule out all single gene conditions.  Both family histories were reviewed and found to be contributory for this couple having a paternal uncle who has very short stature (adult height below 5 feet).  Mr. Kathlen Brown reported that this uncle does not have dwarfism.  The cause of his short stature is not known.  This uncle has 4 children; 3 of 4 have very short stature as well.  There is no other family history of short stature.  Without further information regarding the provided family history, an accurate genetic risk cannot be calculated. Further genetic counseling is warranted if more information is obtained.  Given this couple's ancestry and reported consanguinity, we discussed the availability of screening for hemoglobinopathies. We discussed the recessive inheritance of most hemoglobinopathies.  We discussed that screening for hemoglobinopathies is recommended for this population group by CBC and quantitative hemoglobin electrophoresis.  As iron deficiency is also associated with a low MCV, this should be ruled out by simultaneous serum ferritin studies.  The patient wishes to have this testing through [facility], if it has not been previously performed.   [name] denied exposure to environmental toxins or chemical agents. She denied the use of alcohol, tobacco or street drugs. She denied significant viral illnesses during the course of her pregnancy.   I counseled this couple regarding the above risks and available options.  The approximate face-to-face time with the genetic counselor was 70 minutes.  Sharyne Richters, MS Certified Genetic Counselor

## 2014-03-04 ENCOUNTER — Telehealth (HOSPITAL_COMMUNITY): Payer: Self-pay | Admitting: MS"

## 2014-03-04 NOTE — Telephone Encounter (Signed)
Patient's husband called to inquire regarding amniocentesis results. Discussed that results are still pending and this is still within typical window of turnaround time for results. Results may possibly be available tomorrow. Discussed that I would plan to call him either way tomorrow (Friday) to update him regarding status of results.   Kathleen BraunKaren Travez Stancil 03/04/2014 1:37 PM

## 2014-03-05 ENCOUNTER — Telehealth (HOSPITAL_COMMUNITY): Payer: Self-pay | Admitting: MS"

## 2014-03-05 NOTE — Telephone Encounter (Signed)
Patient's husband called to check on status of amniocentesis results. Discussed with him that [facility], it is still in process and would expect a result, likely early next week. Reviewed that this is still within typical turnaround time range.   Clydie BraunKaren Ladoris Lythgoe 03/05/2014 3:00 PM

## 2014-03-10 ENCOUNTER — Telehealth (HOSPITAL_COMMUNITY): Payer: Self-pay

## 2014-03-10 NOTE — Telephone Encounter (Signed)
Called Mr. Earle Gellbrihim, Recie M Mansouri's husband, as previously planned, to discuss the results from her amniocentesis.  Mr. Marquis Lunchbrahim identified his wife by name and DOB.  We reviewed that these are within normal limits (46,XY).  We again discussed the option of proceeding to microarray analysis.  Mr. Marquis Lunchbrahim stated that he would discuss with his wife and call me if they are interested.  He understands that this decision is time sensitive.  It was explained that while reassuring, a normal fetal karyotype does not rule out all genetic conditions.  Mr. Marquis Lunchbrahim reported that his wife has not noticed any concerning symptoms or complications following the procedure.  All questions were answered, they were encouraged to call with additional questions or concerns.  Donald Prosehristy S. Rishita Petron, MS Certified Genetic Counselor

## 2014-03-10 NOTE — Telephone Encounter (Signed)
Mr. [name] called inquiring about his wife's amniocentesis results.  I returned his call; results should be available in the next 1-2 days. All questions answered.  Kathleen Ariashristy Barri Neidlinger, MS  Certified Genetic Counselor

## 2014-04-16 ENCOUNTER — Other Ambulatory Visit (HOSPITAL_COMMUNITY): Payer: Self-pay | Admitting: Obstetrics and Gynecology

## 2014-04-16 DIAGNOSIS — Z843 Family history of consanguinity: Secondary | ICD-10-CM

## 2014-04-16 DIAGNOSIS — O358XX0 Maternal care for other (suspected) fetal abnormality and damage, not applicable or unspecified: Secondary | ICD-10-CM

## 2014-04-19 ENCOUNTER — Ambulatory Visit (HOSPITAL_COMMUNITY)
Admission: RE | Admit: 2014-04-19 | Discharge: 2014-04-19 | Disposition: A | Discharge status: Home / Self Care | Payer: 59 | Source: Ambulatory Visit | Attending: Obstetrics and Gynecology | Admitting: Obstetrics and Gynecology

## 2014-04-19 VITALS — BP 105/59 | HR 72 | Wt 161.2 lb

## 2014-04-19 DIAGNOSIS — Z3689 Encounter for other specified antenatal screening: Secondary | ICD-10-CM | POA: Diagnosis not present

## 2014-04-19 DIAGNOSIS — Z843 Family history of consanguinity: Secondary | ICD-10-CM | POA: Diagnosis not present

## 2014-04-19 DIAGNOSIS — O358XX0 Maternal care for other (suspected) fetal abnormality and damage, not applicable or unspecified: Secondary | ICD-10-CM | POA: Insufficient documentation

## 2014-05-17 ENCOUNTER — Encounter (HOSPITAL_COMMUNITY): Payer: Self-pay

## 2014-05-17 ENCOUNTER — Ambulatory Visit (HOSPITAL_COMMUNITY)
Admission: RE | Admit: 2014-05-17 | Discharge: 2014-05-17 | Disposition: A | Discharge status: Home / Self Care | Payer: 59 | Source: Ambulatory Visit | Attending: Obstetrics and Gynecology | Admitting: Obstetrics and Gynecology

## 2014-05-17 VITALS — BP 112/68 | HR 76 | Wt 170.2 lb

## 2014-05-17 DIAGNOSIS — Z843 Family history of consanguinity: Secondary | ICD-10-CM | POA: Insufficient documentation

## 2014-05-17 DIAGNOSIS — O359XX1 Maternal care for (suspected) fetal abnormality and damage, unspecified, fetus 1: Secondary | ICD-10-CM

## 2014-05-17 DIAGNOSIS — O358XX0 Maternal care for other (suspected) fetal abnormality and damage, not applicable or unspecified: Secondary | ICD-10-CM | POA: Insufficient documentation

## 2014-05-17 DIAGNOSIS — O359XX0 Maternal care for (suspected) fetal abnormality and damage, unspecified, not applicable or unspecified: Secondary | ICD-10-CM | POA: Diagnosis present

## 2014-05-17 DIAGNOSIS — Z3A30 30 weeks gestation of pregnancy: Secondary | ICD-10-CM | POA: Insufficient documentation

## 2014-05-26 ENCOUNTER — Other Ambulatory Visit (HOSPITAL_COMMUNITY): Payer: Self-pay | Admitting: Maternal and Fetal Medicine

## 2014-05-26 DIAGNOSIS — Z843 Family history of consanguinity: Secondary | ICD-10-CM

## 2014-05-26 DIAGNOSIS — O359XX1 Maternal care for (suspected) fetal abnormality and damage, unspecified, fetus 1: Secondary | ICD-10-CM

## 2014-05-31 ENCOUNTER — Encounter (HOSPITAL_COMMUNITY): Payer: Self-pay

## 2014-06-14 ENCOUNTER — Ambulatory Visit (HOSPITAL_COMMUNITY)
Admission: RE | Admit: 2014-06-14 | Discharge: 2014-06-14 | Disposition: A | Discharge status: Home / Self Care | Payer: 59 | Source: Ambulatory Visit | Attending: Obstetrics and Gynecology | Admitting: Obstetrics and Gynecology

## 2014-06-14 DIAGNOSIS — Z3A34 34 weeks gestation of pregnancy: Secondary | ICD-10-CM | POA: Insufficient documentation

## 2014-06-14 DIAGNOSIS — O359XX1 Maternal care for (suspected) fetal abnormality and damage, unspecified, fetus 1: Secondary | ICD-10-CM

## 2014-06-14 DIAGNOSIS — Z843 Family history of consanguinity: Secondary | ICD-10-CM | POA: Diagnosis not present

## 2014-06-14 DIAGNOSIS — O358XX0 Maternal care for other (suspected) fetal abnormality and damage, not applicable or unspecified: Secondary | ICD-10-CM | POA: Insufficient documentation

## 2014-06-14 DIAGNOSIS — O359XX0 Maternal care for (suspected) fetal abnormality and damage, unspecified, not applicable or unspecified: Secondary | ICD-10-CM | POA: Insufficient documentation

## 2014-06-28 LAB — OB RESULTS CONSOLE GBS: STREP GROUP B AG: POSITIVE

## 2014-07-19 ENCOUNTER — Other Ambulatory Visit (HOSPITAL_COMMUNITY): Payer: Self-pay | Admitting: Obstetrics and Gynecology

## 2014-07-19 DIAGNOSIS — O480 Post-term pregnancy: Secondary | ICD-10-CM

## 2014-07-25 ENCOUNTER — Inpatient Hospital Stay (HOSPITAL_COMMUNITY)
Admission: AD | Admit: 2014-07-25 | Discharge: 2014-07-27 | DRG: 775 | Disposition: A | Discharge status: Home / Self Care | Payer: 59 | Source: Ambulatory Visit | Attending: Obstetrics and Gynecology | Admitting: Obstetrics and Gynecology

## 2014-07-25 ENCOUNTER — Encounter (HOSPITAL_COMMUNITY): Payer: Self-pay

## 2014-07-25 DIAGNOSIS — O99824 Streptococcus B carrier state complicating childbirth: Principal | ICD-10-CM | POA: Diagnosis present

## 2014-07-25 DIAGNOSIS — Z3A40 40 weeks gestation of pregnancy: Secondary | ICD-10-CM | POA: Diagnosis present

## 2014-07-25 DIAGNOSIS — O480 Post-term pregnancy: Secondary | ICD-10-CM | POA: Diagnosis present

## 2014-07-25 DIAGNOSIS — Z3483 Encounter for supervision of other normal pregnancy, third trimester: Secondary | ICD-10-CM | POA: Diagnosis present

## 2014-07-25 LAB — CBC
HEMATOCRIT: 36.6 % (ref 36.0–46.0)
Hemoglobin: 12.2 g/dL (ref 12.0–15.0)
MCH: 27.5 pg (ref 26.0–34.0)
MCHC: 33.3 g/dL (ref 30.0–36.0)
MCV: 82.6 fL (ref 78.0–100.0)
Platelets: 177 10*3/uL (ref 150–400)
RBC: 4.43 MIL/uL (ref 3.87–5.11)
RDW: 14.6 % (ref 11.5–15.5)
WBC: 8.7 10*3/uL (ref 4.0–10.5)

## 2014-07-25 MED ORDER — OXYTOCIN 40 UNITS IN LACTATED RINGERS INFUSION - SIMPLE MED
62.5000 mL/h | INTRAVENOUS | Status: DC
  Filled 2014-07-25: qty 1000

## 2014-07-25 MED ORDER — ONDANSETRON HCL 4 MG/2ML IJ SOLN: 4.0000 mg | Freq: Four times a day (QID) | INTRAMUSCULAR | Status: DC | PRN

## 2014-07-25 MED ORDER — EPHEDRINE 5 MG/ML INJ
10.0000 mg | INTRAVENOUS | Status: DC | PRN
  Filled 2014-07-25: qty 2

## 2014-07-25 MED ORDER — LACTATED RINGERS IV SOLN
INTRAVENOUS | Status: DC
  Administered 2014-07-25: 21:00:00 via INTRAVENOUS

## 2014-07-25 MED ORDER — OXYTOCIN 40 UNITS IN LACTATED RINGERS INFUSION - SIMPLE MED: 1.0000 m[IU]/min | INTRAVENOUS | Status: DC

## 2014-07-25 MED ORDER — FENTANYL 2.5 MCG/ML BUPIVACAINE 1/10 % EPIDURAL INFUSION (WH - ANES): 14.0000 mL/h | INTRAMUSCULAR | Status: DC | PRN

## 2014-07-25 MED ORDER — LACTATED RINGERS IV SOLN: 500.0000 mL | Freq: Once | INTRAVENOUS | Status: DC

## 2014-07-25 MED ORDER — ACETAMINOPHEN 325 MG PO TABS: 650.0000 mg | ORAL_TABLET | ORAL | Status: DC | PRN

## 2014-07-25 MED ORDER — LACTATED RINGERS IV SOLN: 500.0000 mL | INTRAVENOUS | Status: DC | PRN

## 2014-07-25 MED ORDER — OXYCODONE-ACETAMINOPHEN 5-325 MG PO TABS: 2.0000 | ORAL_TABLET | ORAL | Status: DC | PRN

## 2014-07-25 MED ORDER — OXYCODONE-ACETAMINOPHEN 5-325 MG PO TABS: 1.0000 | ORAL_TABLET | ORAL | Status: DC | PRN

## 2014-07-25 MED ORDER — VANCOMYCIN HCL IN DEXTROSE 1-5 GM/200ML-% IV SOLN
1000.0000 mg | Freq: Two times a day (BID) | INTRAVENOUS | Status: DC
  Administered 2014-07-25: 1000 mg via INTRAVENOUS
  Filled 2014-07-25 (×2): qty 200

## 2014-07-25 MED ORDER — LIDOCAINE HCL (PF) 1 % IJ SOLN
30.0000 mL | INTRAMUSCULAR | Status: DC | PRN
  Administered 2014-07-25: 30 mL via SUBCUTANEOUS
  Filled 2014-07-25: qty 30

## 2014-07-25 MED ORDER — TERBUTALINE SULFATE 1 MG/ML IJ SOLN: 0.2500 mg | Freq: Once | INTRAMUSCULAR | Status: AC | PRN

## 2014-07-25 MED ORDER — NALBUPHINE HCL 10 MG/ML IJ SOLN
5.0000 mg | INTRAMUSCULAR | Status: DC | PRN
  Administered 2014-07-25: 5 mg via INTRAVENOUS
  Filled 2014-07-25: qty 1

## 2014-07-25 MED ORDER — OXYTOCIN BOLUS FROM INFUSION
500.0000 mL | INTRAVENOUS | Status: DC
  Administered 2014-07-25: 500 mL via INTRAVENOUS

## 2014-07-25 MED ORDER — EPHEDRINE 5 MG/ML INJ
10.0000 mg | INTRAVENOUS | Status: DC | PRN
Start: 2014-07-25 — End: 2014-07-26
  Filled 2014-07-25: qty 2

## 2014-07-25 MED ORDER — PHENYLEPHRINE 40 MCG/ML (10ML) SYRINGE FOR IV PUSH (FOR BLOOD PRESSURE SUPPORT)
80.0000 ug | PREFILLED_SYRINGE | INTRAVENOUS | Status: DC | PRN
  Filled 2014-07-25: qty 2

## 2014-07-25 MED ORDER — CITRIC ACID-SODIUM CITRATE 334-500 MG/5ML PO SOLN: 30.0000 mL | ORAL | Status: DC | PRN

## 2014-07-25 MED ORDER — DIPHENHYDRAMINE HCL 50 MG/ML IJ SOLN: 12.5000 mg | INTRAMUSCULAR | Status: DC | PRN

## 2014-07-25 NOTE — MAU Note (Signed)
Pt presents complaining of contractions every 5 minutes for the last hour. States she has had irregular contractions throughout the day. Denies bleeding or leaking of fluid.

## 2014-07-25 NOTE — H&P (Signed)
[name] is a 28 y.o. female, G3 P2002 at 40.1 weeks arrived  C/O  VE 5/90/-2 BBW  Patient Active Problem List   Diagnosis Date Noted  . Normal labor 07/25/2014  . Consanguinity   . Fetal abnormality during pregnancy, antepartum   . [redacted] weeks gestation of pregnancy   . Other known or suspected fetal abnormality, not elsewhere classified, affecting management of mother, antepartum condition or complication 02/24/2014  . IUD threads lost 06/02/2012    Pregnancy Course: Patient entered care at 7.5 weeks.   EDC of 07/24/14 was established by US.      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 Headache(784.0)   . Allergy   . Asthma    Past Surgical History  Procedure Laterality Date  . No past surgeries    . Hysteroscopy w/d&c  06/02/2012    Procedure: DILATATION AND CURETTAGE /HYSTEROSCOPY;  Surgeon: Dorien Chihuahuaara J. Richardson Doppole, MD;  Location: [facility];  Service: Gynecology;  Laterality: N/A;  . Iud removal  06/02/2012    Procedure: INTRAUTERINE DEVICE (IUD) REMOVAL;  Surgeon: Dorien Chihuahuaara J. Richardson Doppole, MD;  Location: [facility];  Service: Gynecology;  Laterality: N/A;  . Iud removal      October 2013   Family History: family history is not on file. Social History:  reports that she has never smoked. She does not have any smokeless tobacco history on file. She reports that she does not drink alcohol or use illicit drugs.   Prenatal Transfer Tool  Maternal Diabetes: No Genetic Screening: Abnormal:  Results: Other:  Maternal  Ultrasounds/Referrals: Abnormal:  Findings:   Fetal Kidney Anomalies, Other: clubbed foot Fetal Ultrasounds or other Referrals:  Referred to Materal Fetal Medicine  Maternal Substance Abuse:  No Significant Maternal Medications:  None Significant Maternal Lab Results: Lab values include: Group B Strep positive   ROS:  See HPI above, all other systems are negative  Allergies  Allergen Reactions  . Penicillins Cross Reactors Shortness Of Breath    5/90/-1 Blood pressure 107/61, pulse 75, temperature 98.7 F (37.1 C), temperature source Oral, resp. rate 18, height 5\' 6"  (1.676 m), weight 182 lb (82.555 kg).  Maternal Exam:  Uterine Assessment: Contraction frequency is rare.  Abdomen: Gravid, non tender. Fundal height is aga.  Normal external genitalia, vulva, cervix, uterus and adnexa.  No lesions noted on exam.  Pelvis adequate for delivery.  Fetal presentation: Vertex by VE  Fetal Exam:  Monitor Surveillance : Continuous Monitoring / Intermitting per protocol Mode: Ultrasound.  NICHD: Category 1 CTXs: Q 4-5 minutes EFW   7 lbs  Physical Exam: Nursing note and vitals reviewed General: alert and cooperative She appears well nourished Psychiatric: Normal mood and affect. Her behavior is normal Head: Normocephalic Eyes: Pupils are equal, round, and reactive to light Neck:  Normal range of motion Cardiovascular: RRR without murmur  Respiratory: CTAB. Effort normal  Abd: soft, non-tender, +BS, no rebound, no guarding  Genitourinary: Vagina normal  Neurological: A&Ox3 Skin: Warm and dry  Musculoskeletal: Normal range of motion  Homan's sign negative bilaterally No evidence of DVTs.  Edema: 1+ edema bilaterally non-pitting edema DTR: 2+ Clonus: None   Prenatal labs: ABO, Rh:  A positive Antibody:  Negative Rubella:   immune RPR:   NR HBsAg:   negative HIV:   negative GBS:  positive Sickle cell/Hgb electrophoresis:  WNL Pap:   GC:    Chlamydia:  Genetic  screenings:   abn Glucola:  wnl  Assessment:  IUP at 40.1 weeks NICHD: Category 1 Membranes: INTACT GBS POSITIVE  Plan:  Admit to L&D for expectant management of labor. Possible augmentation options reviewed including AROM and/or pitocin.   GBS prophylaxis Pt allergic to PCN, treat with Vancomycin  IV pain medication per orders PRN Epidural per patient request Foley cath after patient is comfortable with epidural Anticipate SVD  Labor mgmt as ordered  Okay to ambulate around unit with wireless monitors  Okay to get up and shower without monitoring   May auscultate FHR intermittently,  if expectant management     q 30 min in active labor     q 15 min in transition     q 5 min with pushing.     May ambulate without monitoring.     If no active labor, may do NST q 2 hours.   Attending MD available at all times.  Johanthan Kneeland, CNM, MSN 07/25/2014, 10:56 PM       All information will be confirmed upon admisson

## 2014-07-25 NOTE — Progress Notes (Signed)
Called to notify of pt cervical exam. In delivery and nurse answered. [facility]

## 2014-07-26 ENCOUNTER — Encounter (HOSPITAL_COMMUNITY): Payer: Self-pay | Admitting: *Deleted

## 2014-07-26 LAB — CBC
HCT: 36.9 % (ref 36.0–46.0)
Hemoglobin: 12.3 g/dL (ref 12.0–15.0)
MCH: 27.7 pg (ref 26.0–34.0)
MCHC: 33.3 g/dL (ref 30.0–36.0)
MCV: 83.1 fL (ref 78.0–100.0)
Platelets: 164 10*3/uL (ref 150–400)
RBC: 4.44 MIL/uL (ref 3.87–5.11)
RDW: 14.4 % (ref 11.5–15.5)
WBC: 14.1 10*3/uL — ABNORMAL HIGH (ref 4.0–10.5)

## 2014-07-26 LAB — HIV ANTIBODY (ROUTINE TESTING W REFLEX): HIV: NONREACTIVE

## 2014-07-26 LAB — RPR

## 2014-07-26 MED ORDER — BENZOCAINE-MENTHOL 20-0.5 % EX AERO
1.0000 | INHALATION_SPRAY | CUTANEOUS | Status: DC | PRN
Start: 2014-07-26 — End: 2014-07-27

## 2014-07-26 MED ORDER — ONDANSETRON HCL 4 MG/2ML IJ SOLN: 4.0000 mg | INTRAMUSCULAR | Status: DC | PRN

## 2014-07-26 MED ORDER — ZOLPIDEM TARTRATE 5 MG PO TABS: 5.0000 mg | ORAL_TABLET | Freq: Every evening | ORAL | Status: DC | PRN

## 2014-07-26 MED ORDER — PRENATAL MULTIVITAMIN CH
1.0000 | ORAL_TABLET | Freq: Every day | ORAL | Status: DC
  Administered 2014-07-26 – 2014-07-27 (×2): 1 via ORAL
  Filled 2014-07-26 (×2): qty 1

## 2014-07-26 MED ORDER — LANOLIN HYDROUS EX OINT: TOPICAL_OINTMENT | CUTANEOUS | Status: DC | PRN

## 2014-07-26 MED ORDER — OXYCODONE-ACETAMINOPHEN 5-325 MG PO TABS: 2.0000 | ORAL_TABLET | ORAL | Status: DC | PRN

## 2014-07-26 MED ORDER — ONDANSETRON HCL 4 MG PO TABS: 4.0000 mg | ORAL_TABLET | ORAL | Status: DC | PRN

## 2014-07-26 MED ORDER — SIMETHICONE 80 MG PO CHEW: 80.0000 mg | CHEWABLE_TABLET | ORAL | Status: DC | PRN

## 2014-07-26 MED ORDER — SENNOSIDES-DOCUSATE SODIUM 8.6-50 MG PO TABS
2.0000 | ORAL_TABLET | ORAL | Status: DC
  Administered 2014-07-26: 2 via ORAL
  Filled 2014-07-26: qty 2

## 2014-07-26 MED ORDER — DIBUCAINE 1 % RE OINT
1.0000 | TOPICAL_OINTMENT | RECTAL | Status: DC | PRN
Start: 2014-07-26 — End: 2014-07-27

## 2014-07-26 MED ORDER — TETANUS-DIPHTH-ACELL PERTUSSIS 5-2.5-18.5 LF-MCG/0.5 IM SUSP: 0.5000 mL | Freq: Once | INTRAMUSCULAR | Status: DC

## 2014-07-26 MED ORDER — DIPHENHYDRAMINE HCL 25 MG PO CAPS: 25.0000 mg | ORAL_CAPSULE | Freq: Four times a day (QID) | ORAL | Status: DC | PRN

## 2014-07-26 MED ORDER — WITCH HAZEL-GLYCERIN EX PADS: 1.0000 "application " | MEDICATED_PAD | CUTANEOUS | Status: DC | PRN

## 2014-07-26 MED ORDER — IBUPROFEN 600 MG PO TABS
600.0000 mg | ORAL_TABLET | Freq: Four times a day (QID) | ORAL | Status: DC
  Administered 2014-07-26 – 2014-07-27 (×6): 600 mg via ORAL
  Filled 2014-07-26 (×7): qty 1

## 2014-07-26 MED ORDER — OXYCODONE-ACETAMINOPHEN 5-325 MG PO TABS
1.0000 | ORAL_TABLET | ORAL | Status: DC | PRN
  Administered 2014-07-26 (×3): 1 via ORAL
  Filled 2014-07-26 (×3): qty 1

## 2014-07-26 NOTE — Lactation Note (Addendum)
This note was copied from the chart of Boy Braxton FeathersSakiena Bieri. Lactation Consultation Note Experienced BF mom who BF her 1st child for 1 yr., and her 2nd child for 10 1/2 months. Had some nipple soreness at first but that was all. Mom speaks aerobic, speaks some English. Asked questions and answered appropriately how long she had BF her other children. Assisted in latching, baby rooting. Mom explained that she had BF baby, but she had no milk. Hand expression taught and noted colostrum. Mom said she only had a little and wasn't enough. Assisted baby in latch and BF well. Mom encouraged to feed baby 8-12 times/24 hours and with feeding cues. Mom reports + breast changes w/pregnancy. Mom encouraged to feed baby w/feeding cues. Referred to Baby and Me Book in Breastfeeding section Pg. 22-23 for position options and Proper latch demonstration. [facility] brochure given w/resources, support groups and LC services.  Patient Name: Boy Braxton FeathersSakiena Frei UJWJX'BToday's Date: 07/26/2014 Reason for consult: Initial assessment   Maternal Data Has patient been taught Hand Expression?: Yes Does the patient have breastfeeding experience prior to this delivery?: Yes  Feeding Feeding Type: Breast Fed Length of feed: 10 min  LATCH Score/Interventions Latch: Repeated attempts needed to sustain latch, nipple held in mouth throughout feeding, stimulation needed to elicit sucking reflex.  Audible Swallowing: None Intervention(s): Skin to skin;Hand expression Intervention(s): Alternate breast massage  Type of Nipple: Everted at rest and after stimulation  Comfort (Breast/Nipple): Soft / non-tender     Hold (Positioning): Assistance needed to correctly position infant at breast and maintain latch. Intervention(s): Breastfeeding basics reviewed;Support Pillows;Position options;Skin to skin  LATCH Score: 6  Lactation Tools Discussed/Used     Consult Status Consult Status: Follow-up Date: 07/26/14 Follow-up type:  In-patient    Tabitha Tupper, Diamond NickelLAURA G 07/26/2014, 3:24 AM

## 2014-07-26 NOTE — Discharge Instructions (Signed)

## 2014-07-26 NOTE — Progress Notes (Signed)
Postpartum day #0, NSVD prior to 0030  Subjective Pt without complaints.  Lochia normal.  Pain controlled.  Breast feeding yes. Desires circumcision.    Temp:  [98 F (36.7 C)-98.7 F (37.1 C)] 98.7 F (37.1 C) (12/28 1720) Pulse Rate:  [63-101] 69 (12/28 1720) Resp:  [16-20] 18 (12/28 1720) BP: (105-139)/(61-82) 107/61 mmHg (12/28 1720) SpO2:  [100 %] 100 % (12/28 0250) Weight:  [82.555 kg (182 lb)] 82.555 kg (182 lb) (12/27 2119)  Gen:  NAD, A&O x 3 Uterine fundus:  Firm, nontender Lochia normal Ext:  2+Edema, no calf tenderness bilaterally  CBC    Component Value Date/Time   WBC 14.1* 07/26/2014 0536   WBC 8.3 05/01/2012 2100   RBC 4.44 07/26/2014 0536   RBC 4.79 05/01/2012 2100   HGB 12.3 07/26/2014 0536   HGB 13.3 05/01/2012 2100   HCT 36.9 07/26/2014 0536   HCT 40.6 05/01/2012 2100   PLT 164 07/26/2014 0536   MCV 83.1 07/26/2014 0536   MCV 84.7 05/01/2012 2100   MCH 27.7 07/26/2014 0536   MCH 27.8 05/01/2012 2100   MCHC 33.3 07/26/2014 0536   MCHC 32.8 05/01/2012 2100   RDW 14.4 07/26/2014 0536   LYMPHSABS 1.7 01/09/2009 1125   MONOABS 0.4 01/09/2009 1125   EOSABS 0.1 01/09/2009 1125   BASOSABS 0.1 01/09/2009 1125     A/P: S/p SVD doing well. Routine postpartum care. Lactation support. Discharge in am. Circumcision cancelled this evening b/c baby has not voided.  Pt consented.  Consent has been signed.  [name], [name] 07/26/2014, 7:19 PM

## 2014-07-27 MED ORDER — IBUPROFEN 600 MG PO TABS: 600.0000 mg | ORAL_TABLET | Freq: Four times a day (QID) | ORAL | Status: DC

## 2014-07-27 NOTE — Lactation Note (Signed)
This note was copied from the chart of Kathleen Braxton FeathersSakiena Desena. Lactation Consultation Note  Patient Name: [name] ZOXWR'UToday's Date: 07/27/2014 Reason for consult: Follow-up assessment  Baby 38 hours of life. Mom declines international interpretive services and requests that we use FOB for interpretation. Parents state that baby is in nursery for circumcision. Discussed with parents that baby will probably be sleepy for 4-6 hours, but to offer lots of STS and offer breast with cues. Mom states that she doesn't feel like she has as much colostrum as she did with her second child. Mom reports that her milk was starting to come in at this point she thinks. Mom demonstrated hand expression with immediate drops of colostrum visible. Gave mom a hand pump with instruction, a lidded cup to hand express colostrum into, and a curve-tipped syringe with instructions for supplementing baby with EBM. Enc mom to use hand pump for additional breast stimulation. Also enc mom to use her own DEBP for additional stimulation after BF if milk not in by day 3. Enc mom to call for assistance with a BF if baby cueing before they are D/C'd--2 hours after circumcision. Mom aware of OP/BFSG and LC phone line assistance for after D/C.      Maternal Data    Feeding Feeding Type: Breast Fed Length of feed: 20 min  LATCH Score/Interventions                      Lactation Tools Discussed/Used     Consult Status Consult Status: Complete    [name] 07/27/2014, 2:03 PM

## 2014-07-27 NOTE — Progress Notes (Signed)
Postpartum Day #1  Subjective Pt without complaints, resting comfortably in bed.  Lochia normal.  Pain controlled.  Breast feeding yes. Desires circumcision. No F/C/CP/SOB.  Ambulating and voiding without difficulty.   Temp:  [97.9 F (36.6 C)-98.7 F (37.1 C)] 97.9 F (36.6 C) (12/29 0507) Pulse Rate:  [63-78] 78 (12/29 0507) Resp:  [16-18] 18 (12/29 0507) BP: (98-107)/(61-67) 98/64 mmHg (12/29 0507)  Gen:  NAD, A&O x 3 CV: RRR Lungs; CTAB Uterine fundus:  Firm, nontender, @ umbilicus Lochia normal Ext:  1+Edema, no calf tenderness bilaterally  CBC    Component Value Date/Time   WBC 14.1* 07/26/2014 0536   WBC 8.3 05/01/2012 2100   RBC 4.44 07/26/2014 0536   RBC 4.79 05/01/2012 2100   HGB 12.3 07/26/2014 0536   HGB 13.3 05/01/2012 2100   HCT 36.9 07/26/2014 0536   HCT 40.6 05/01/2012 2100   PLT 164 07/26/2014 0536   MCV 83.1 07/26/2014 0536   MCV 84.7 05/01/2012 2100   MCH 27.7 07/26/2014 0536   MCH 27.8 05/01/2012 2100   MCHC 33.3 07/26/2014 0536   MCHC 32.8 05/01/2012 2100   RDW 14.4 07/26/2014 0536   LYMPHSABS 1.7 01/09/2009 1125   MONOABS 0.4 01/09/2009 1125   EOSABS 0.1 01/09/2009 1125   BASOSABS 0.1 01/09/2009 1125    A/P: 28yo [id number] s/p NSVD Doing well and meeting postpartum milestones appropriately Continue routine postpartum care. Lactation support. Baby has still not voided, will plan to perform circumcision later today. Plan for discharge home later today.   Myna HidalgoZAN, Jaston Havens, M 07/27/2014, 6:43 AM

## 2014-07-29 ENCOUNTER — Ambulatory Visit (HOSPITAL_COMMUNITY): Admission: RE | Admit: 2014-07-29 | Payer: 59 | Source: Ambulatory Visit

## 2014-07-29 NOTE — Discharge Summary (Signed)
Obstetric Discharge Summary Reason for Admission: onset of labor Prenatal Procedures: none Intrapartum Procedures: spontaneous vaginal delivery Postpartum Procedures: none Complications-Operative and Postpartum: none  Date of Admission: Date of Discharge: HEMOGLOBIN  Date Value Ref Range Status  07/26/2014 12.3 12.0 - 15.0 g/dL Final  40/98/119110/09/2011 47.813.3 12.2 - 16.2 g/dL Final   HCT  Date Value Ref Range Status  07/26/2014 36.9 36.0 - 46.0 % Final   HCT, POC  Date Value Ref Range Status  05/01/2012 40.6 37.7 - 47.9 % Final    Physical Exam:  General: alert and no distress Lochia: appropriate Uterine Fundus: firm, non-tender, below umbilicus Incision: healing well DVT Evaluation: No evidence of DVT seen on physical exam.  [facility]   Hospital Course: 29FA O1H0865@28yo G3P2002@ 4914w1d who presented in active labor.   Pregnancy complicated by:  Delivery performed by CNM- Oneita JollyVenus Williams.  For information regarding the delivery, please see the delivery summary.  Her postpartum course was uncomplicated and she was discharged home in stable condition on PPD#1.  Discharge Information: Date: 07/29/2014 Activity: pelvic rest Diet: routine Medications: PNV and Ibuprofen Condition: stable Instructions: refer to practice specific booklet Discharge to: home Follow-up Information    Follow up with Jessee AversOLE,TARA J., MD. Schedule an appointment as soon as possible for a visit in 6 weeks.   Specialty:  Obstetrics and Gynecology   Why:  Postpartum follow up   Contact information:   [address] [place] [address] [phone]       Follow up In 6 weeks.      Newborn Data: Live born female  Birth Weight: 6 lb 14.4 oz (3130 g) APGAR: 8, 9  Home with mother.  [name], Arbutus Nelligan, M 07/29/2014, 3:43 PM

## 2014-08-02 ENCOUNTER — Inpatient Hospital Stay (HOSPITAL_COMMUNITY): Admission: RE | Admit: 2014-08-02 | Payer: 59 | Source: Ambulatory Visit

## 2015-03-10 ENCOUNTER — Emergency Department (HOSPITAL_COMMUNITY): Payer: 59

## 2015-03-10 ENCOUNTER — Encounter (HOSPITAL_COMMUNITY): Payer: Self-pay

## 2015-03-10 ENCOUNTER — Emergency Department (HOSPITAL_COMMUNITY)
Admission: EM | Admit: 2015-03-10 | Discharge: 2015-03-10 | Disposition: A | Discharge status: Home / Self Care | Payer: 59 | Attending: Emergency Medicine | Admitting: Emergency Medicine

## 2015-03-10 DIAGNOSIS — R110 Nausea: Secondary | ICD-10-CM | POA: Diagnosis not present

## 2015-03-10 DIAGNOSIS — J45909 Unspecified asthma, uncomplicated: Secondary | ICD-10-CM | POA: Insufficient documentation

## 2015-03-10 DIAGNOSIS — Z3202 Encounter for pregnancy test, result negative: Secondary | ICD-10-CM | POA: Diagnosis not present

## 2015-03-10 DIAGNOSIS — R1013 Epigastric pain: Secondary | ICD-10-CM | POA: Insufficient documentation

## 2015-03-10 DIAGNOSIS — Z79899 Other long term (current) drug therapy: Secondary | ICD-10-CM | POA: Diagnosis not present

## 2015-03-10 DIAGNOSIS — Z880 Allergy status to penicillin: Secondary | ICD-10-CM | POA: Diagnosis not present

## 2015-03-10 LAB — CBC
HCT: 39.5 % (ref 36.0–46.0)
Hemoglobin: 12.7 g/dL (ref 12.0–15.0)
MCH: 26.7 pg (ref 26.0–34.0)
MCHC: 32.2 g/dL (ref 30.0–36.0)
MCV: 83 fL (ref 78.0–100.0)
PLATELETS: 187 10*3/uL (ref 150–400)
RBC: 4.76 MIL/uL (ref 3.87–5.11)
RDW: 13.1 % (ref 11.5–15.5)
WBC: 9.5 10*3/uL (ref 4.0–10.5)

## 2015-03-10 LAB — URINALYSIS, ROUTINE W REFLEX MICROSCOPIC
BILIRUBIN URINE: NEGATIVE
GLUCOSE, UA: NEGATIVE mg/dL
Hgb urine dipstick: NEGATIVE
Ketones, ur: NEGATIVE mg/dL
LEUKOCYTES UA: NEGATIVE
NITRITE: NEGATIVE
PH: 6 (ref 5.0–8.0)
Protein, ur: NEGATIVE mg/dL
Specific Gravity, Urine: 1.005 (ref 1.005–1.030)
UROBILINOGEN UA: 0.2 mg/dL (ref 0.0–1.0)

## 2015-03-10 LAB — COMPREHENSIVE METABOLIC PANEL
ALBUMIN: 4.1 g/dL (ref 3.5–5.0)
ALT: 14 U/L (ref 14–54)
ANION GAP: 11 (ref 5–15)
AST: 16 U/L (ref 15–41)
Alkaline Phosphatase: 91 U/L (ref 38–126)
BILIRUBIN TOTAL: 0.7 mg/dL (ref 0.3–1.2)
BUN: 10 mg/dL (ref 6–20)
CALCIUM: 9.5 mg/dL (ref 8.9–10.3)
CO2: 25 mmol/L (ref 22–32)
Chloride: 104 mmol/L (ref 101–111)
Creatinine, Ser: 0.6 mg/dL (ref 0.44–1.00)
GFR calc Af Amer: >60 mL/min (ref >60)
GFR calc non Af Amer: >60 mL/min (ref >60)
GLUCOSE: 91 mg/dL (ref 65–99)
Potassium: 3.8 mmol/L (ref 3.5–5.1)
SODIUM: 140 mmol/L (ref 135–145)
TOTAL PROTEIN: 8.1 g/dL (ref 6.5–8.1)

## 2015-03-10 LAB — POC OCCULT BLOOD, ED: Fecal Occult Bld: NEGATIVE

## 2015-03-10 LAB — POC URINE PREG, ED: Preg Test, Ur: NEGATIVE

## 2015-03-10 LAB — LIPASE, BLOOD: LIPASE: 18 U/L — AB (ref 22–51)

## 2015-03-10 MED ORDER — FAMOTIDINE 20 MG PO TABS: 20.0000 mg | ORAL_TABLET | Freq: Two times a day (BID) | ORAL | Status: DC

## 2015-03-10 MED ORDER — PANTOPRAZOLE SODIUM 40 MG PO TBEC
40.0000 mg | DELAYED_RELEASE_TABLET | Freq: Once | ORAL | Status: AC
  Administered 2015-03-10: 40 mg via ORAL
  Filled 2015-03-10: qty 1

## 2015-03-10 MED ORDER — GI COCKTAIL ~~LOC~~
30.0000 mL | Freq: Once | ORAL | Status: AC
  Administered 2015-03-10: 30 mL via ORAL
  Filled 2015-03-10: qty 30

## 2015-03-10 NOTE — Discharge Instructions (Signed)

## 2015-03-10 NOTE — ED Notes (Signed)
Delay on lab draw,  Ultrasound tech in room

## 2015-03-10 NOTE — ED Notes (Addendum)
Pt c/o intermittent upper abdominal pain x "months" increasing x 3 days.  Pain score 4/10.  Denies n/v/d.  Pt reports pain increases after eating a specific type of food.  Pt could not translate into English the type of food.  Pt have never been seen for complaint.  Upon further assessment, Pt reports the pain began after having a baby x 7 months ago.  Pt has not followed up w/ OB/GYN.

## 2015-03-10 NOTE — ED Provider Notes (Signed)
CSN: [id number]     Arrival date & time 03/10/15  1429 History   First MD Initiated Contact with Patient 03/10/15 1458     Chief Complaint  Patient presents with  . Abdominal Pain     (Consider location/radiation/quality/duration/timing/severity/associated sxs/prior Treatment) HPI Comments: 29 year old female presenting with intermittent abdominal pain 1 month, increasing over the past 3 days. Pain is epigastric, nonradiating, coming and going at random. Occasionally gets worse after eating foods. No specific food makes her pain worse. She tried eating a seed called "Ferugreek" seed with some relief of pain. Admits to occasional nausea without vomiting. No fevers. Over the past 48, her stool has appeared dark and loose, and is going twice daily. Denies any urinary symptoms, vaginal bleeding or discharge. Currently breast-feeding, gave birth 7 months ago. History of hysteroscopy with D&C, no other abdominal surgeries.  Patient is a 29 y.o. female presenting with abdominal pain. The history is provided by the patient.  Abdominal Pain Associated symptoms: nausea     Past Medical History  Diagnosis Date  . Headache(784.0)   . Allergy   . Asthma    Past Surgical History  Procedure Laterality Date  . No past surgeries    . Hysteroscopy w/d&c  06/02/2012    Procedure: DILATATION AND CURETTAGE /HYSTEROSCOPY;  Surgeon: Dorien Chihuahua. Richardson Dopp, MD;  Location: [facility];  Service: Gynecology;  Laterality: N/A;  . Iud removal  06/02/2012    Procedure: INTRAUTERINE DEVICE (IUD) REMOVAL;  Surgeon: Dorien Chihuahua. Richardson Dopp, MD;  Location: [facility];  Service: Gynecology;  Laterality: N/A;  . Iud removal      October 2013   History reviewed. No pertinent family history. Social History  Substance Use Topics  . Smoking status: Never Smoker   . Smokeless tobacco: None  . Alcohol Use: No   OB History    Gravida Para Term Preterm AB TAB SAB Ectopic Multiple Living   0 3     Review of Systems  Gastrointestinal:  Positive for nausea and abdominal pain.       + Dark stool.  All other systems reviewed and are negative.     Allergies  Penicillins cross reactors  Home Medications   Prior to Admission medications   Medication Sig Start Date End Date Taking? Authorizing Provider  Multiple Vitamins-Minerals (MULTIVITAMIN ADULT PO) Take 1 tablet by mouth daily.   Yes Historical Provider, MD   BP 124/66 mmHg  Pulse 80  Temp(Src) 98.5 F (36.9 C) (Oral)  Resp 17  SpO2 98%  Breastfeeding? Yes Physical Exam  Constitutional: She is oriented to person, place, and time. She appears well-developed and well-nourished. No distress.  HENT:  Head: Normocephalic and atraumatic.  Mouth/Throat: Oropharynx is clear and moist.  Eyes: Conjunctivae and EOM are normal.  Neck: Normal range of motion. Neck supple.  Cardiovascular: Normal rate, regular rhythm and normal heart sounds.   Pulmonary/Chest: Effort normal and breath sounds normal. No respiratory distress.  Abdominal: Normal appearance. She exhibits no distension. There is tenderness in the epigastric area. There is no rigidity, no rebound, no guarding, no CVA tenderness, no tenderness at McBurney's point and negative Murphy's sign.  No peritoneal signs.  Genitourinary: Rectal exam shows no external hemorrhoid, no internal hemorrhoid, no fissure and no tenderness. Guaiac negative stool.  No BRB on exam glove.  Musculoskeletal: Normal range of motion. She exhibits no edema.  Neurological: She is alert and oriented to person, place, and time. No  sensory deficit.  Skin: Skin is warm and dry.  Psychiatric: She has a normal mood and affect. Her behavior is normal.  Nursing note and vitals reviewed.   ED Course  Procedures (including critical care time) Labs Review Labs Reviewed  LIPASE, BLOOD - Abnormal; Notable for the following:    Lipase 18 (*)    All other components within normal limits  CBC  COMPREHENSIVE METABOLIC PANEL  URINALYSIS, ROUTINE W  REFLEX MICROSCOPIC (NOT AT [facility])  POC OCCULT BLOOD, ED  POC URINE PREG, ED    Imaging Review US Abdomen Complete  03/10/2015   CLINICAL DATA:  Left side abdominal pain for 1 month.  EXAM: ULTRASOUND ABDOMEN COMPLETE  COMPARISON:  None.  FINDINGS: Gallbladder: No gallstones or wall thickening visualized. No sonographic Murphy sign noted.  Common bile duct: Diameter: 0.3 cm  Liver: No focal lesion identified. Within normal limits in parenchymal echogenicity.  IVC: No abnormality visualized.  Pancreas: Visualized portion unremarkable.  Spleen: Size and appearance within normal limits.  Right Kidney: Length: 9.5 cm. Echogenicity within normal limits. No mass or hydronephrosis visualized.  Left Kidney: Length: 10.8 cm. Echogenicity within normal limits. No mass or hydronephrosis visualized.  Abdominal aorta: No aneurysm visualized.  Other findings: None.  IMPRESSION: Negative for gallstones.  Normal examination.   Electronically Signed   By: Drusilla Kanner M.D.   On: 03/10/2015 16:28     EKG Interpretation None      MDM   Final diagnoses:  Epigastric abdominal pain   Nontoxic appearing, NAD. AFVSS. Abdomen soft with no peritoneal signs. Labs, abdominal ultrasound pending. Concern for gallstones versus gastric ulcer (with dark stool).  Ultrasound normal. Labs but acute finding. Patient given GI cocktail and Protonix with only mild relief of her symptoms. Her abdomen remained soft without any peritoneal signs and only mildly tender in epigastrium. Probable heartburn/GERD. Advised the patient to keep a log of what she is eating and when the pain occurs and to follow-up with her primary care physician. Stable for discharge. Return precautions given. Patient states understanding of treatment care plan and is agreeable.  Kathrynn Speed, PA-C 03/10/15 1746  Marily Memos, MD 03/11/15 [phone]

## 2015-03-10 NOTE — ED Notes (Signed)
ED PA at bedside

## 2015-03-10 NOTE — ED Notes (Signed)
EDPA at bedside. Merle RN assisting

## 2016-04-30 IMAGING — US US OB COMP LESS 14 WK
1 series · 14 of 21 positions shown · non-contrast
Comparison: None.

CLINICAL DATA: Vaginal bleeding and pelvic pain.

EXAM:
OBSTETRIC <14 WK ULTRASOUND
TECHNIQUE: Transabdominal ultrasound was performed for evaluation of the
gestation as well as the maternal uterus and adnexal regions.

[Series 1: us ob comp less 14 wks · 21 acquisitions, 14 frames shown]
[im 1/21]
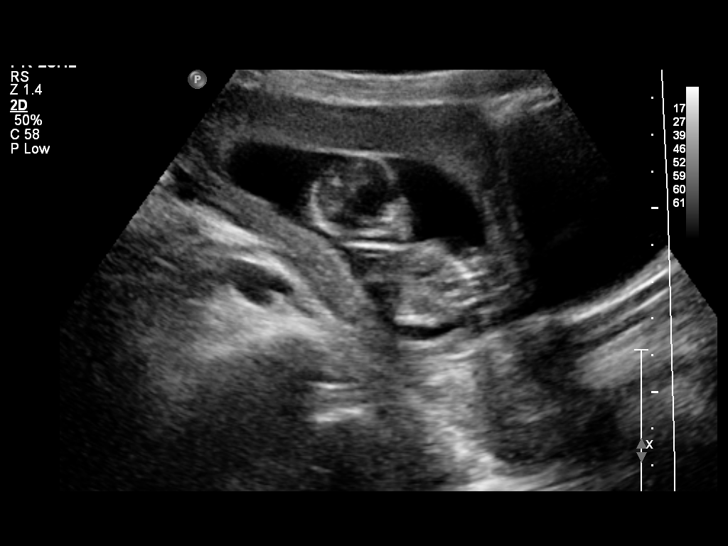
[im 3/21]
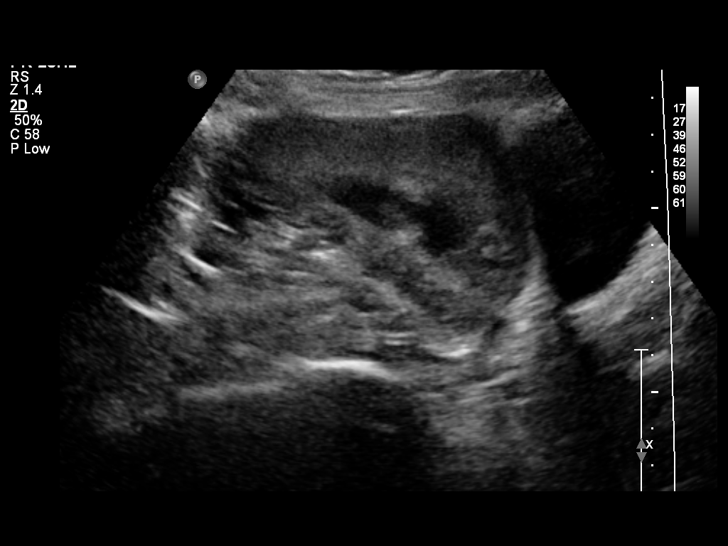
[im 4/21]
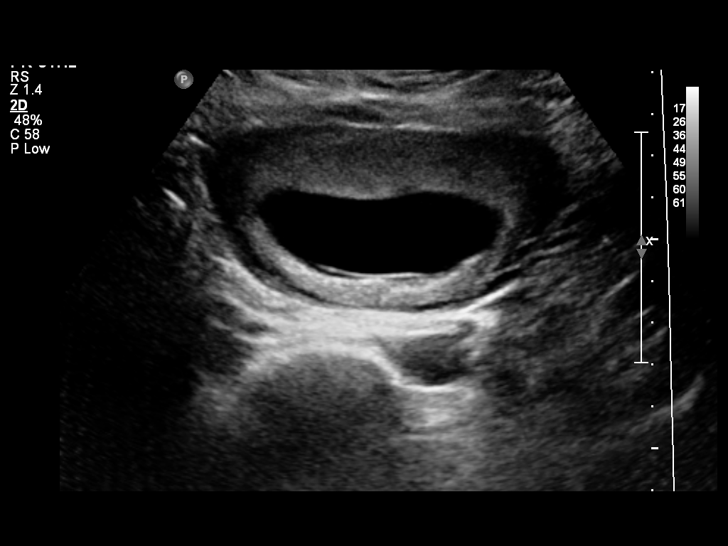
[im 6/21]
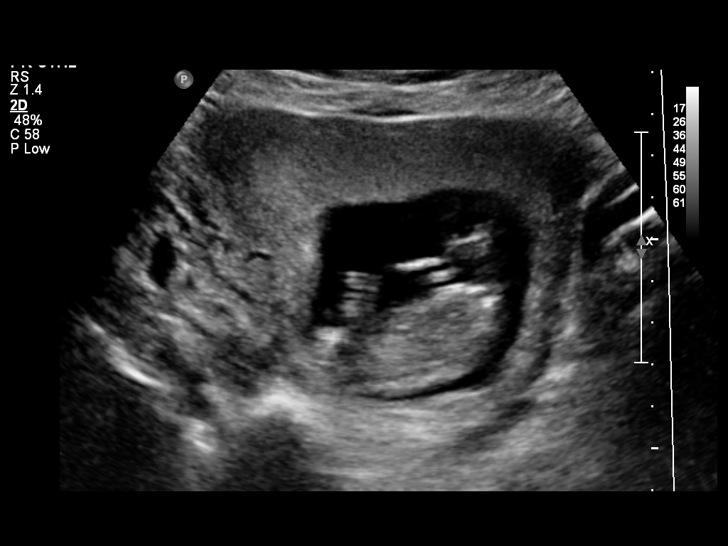
[im 7/21]
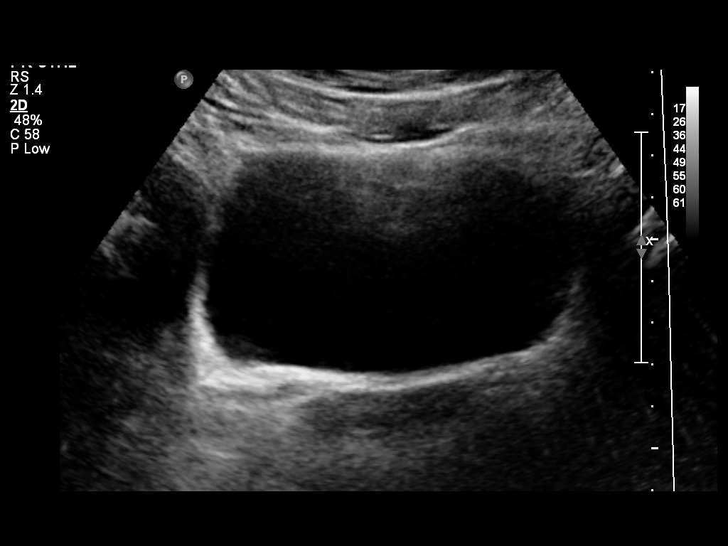
[im 9/21]
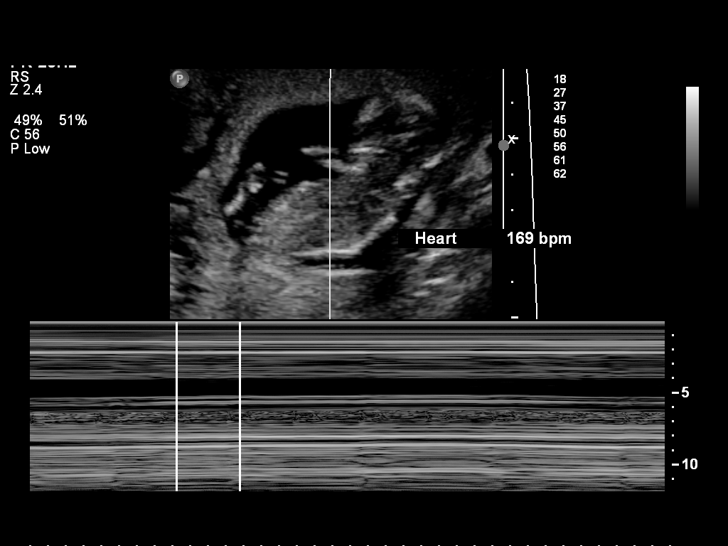
[im 10/21]
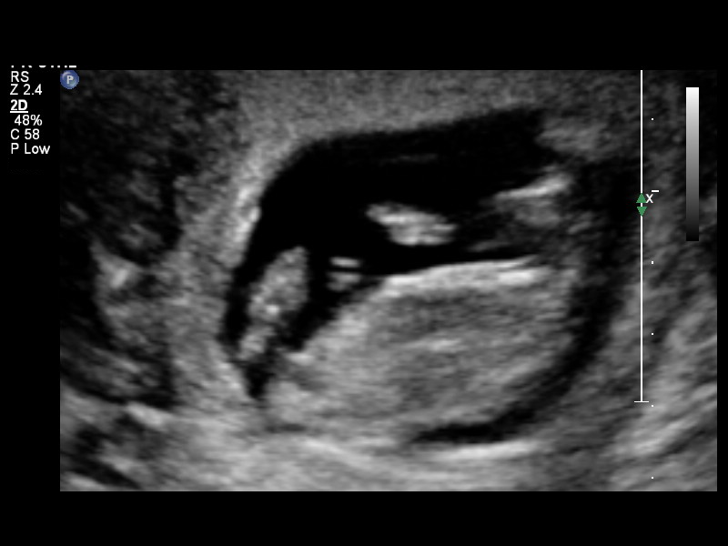
[im 12/21]
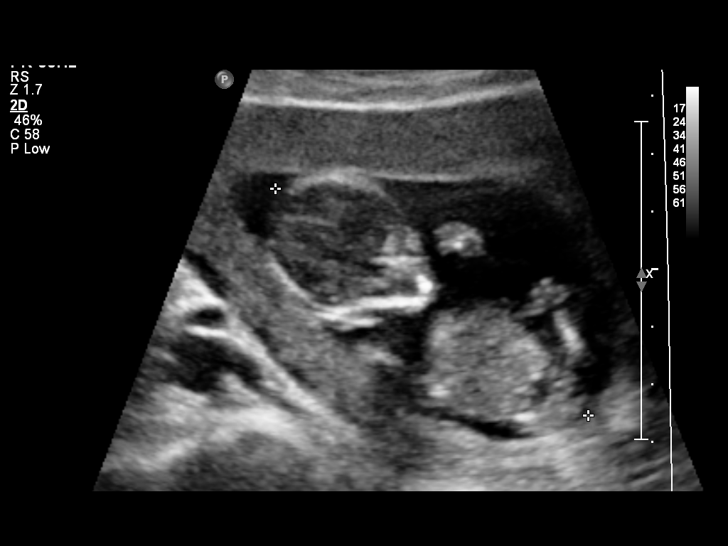
[im 13/21]
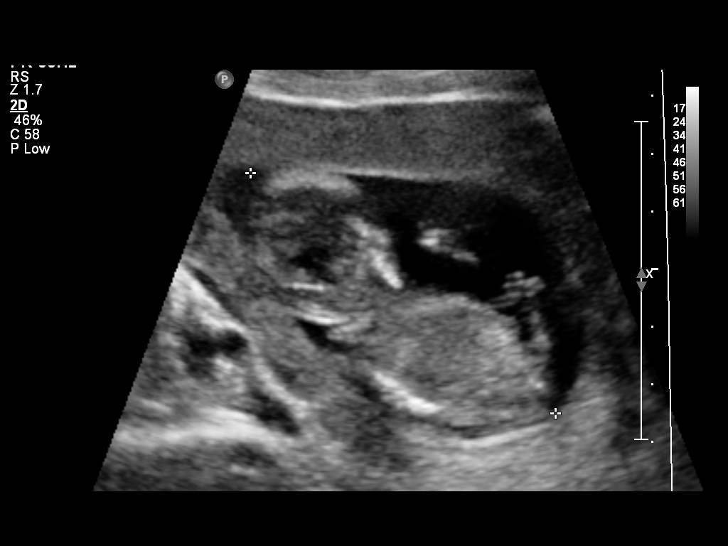
[im 15/21]
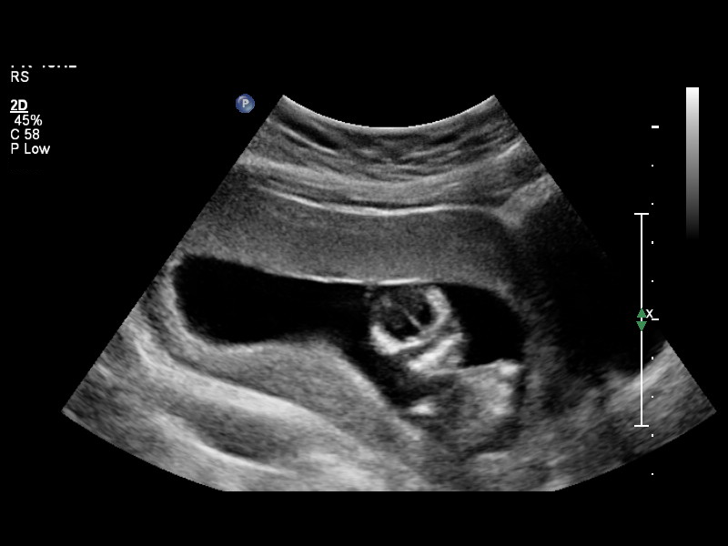
[im 16/21]
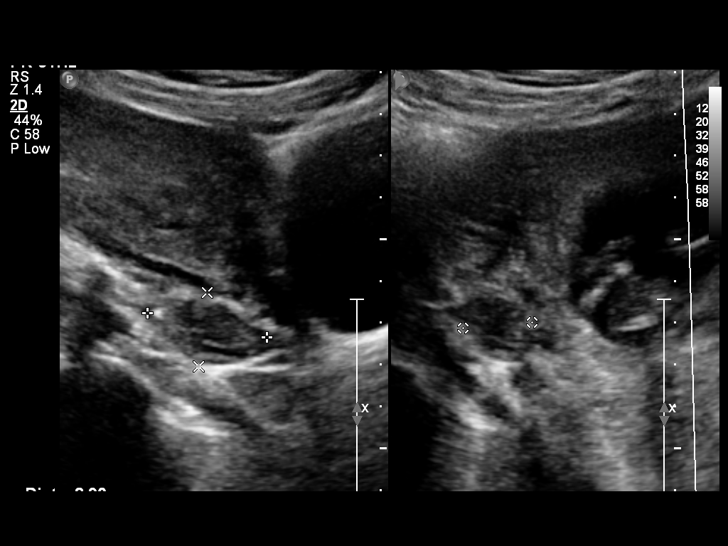
[im 18/21]
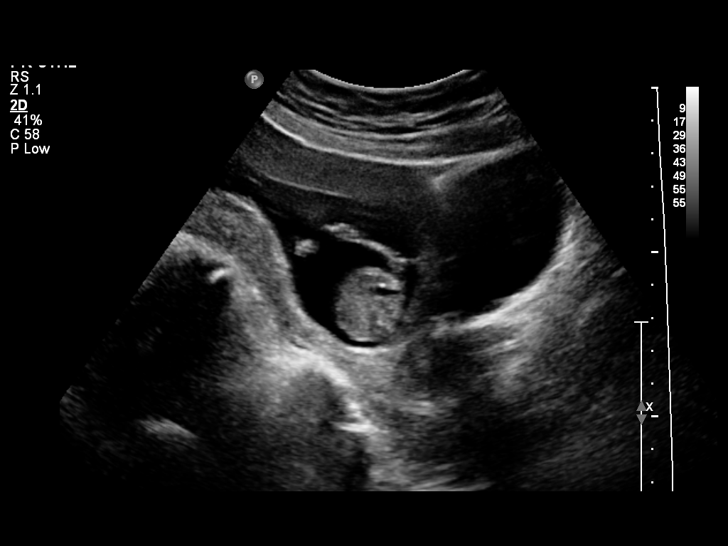
[im 19/21]
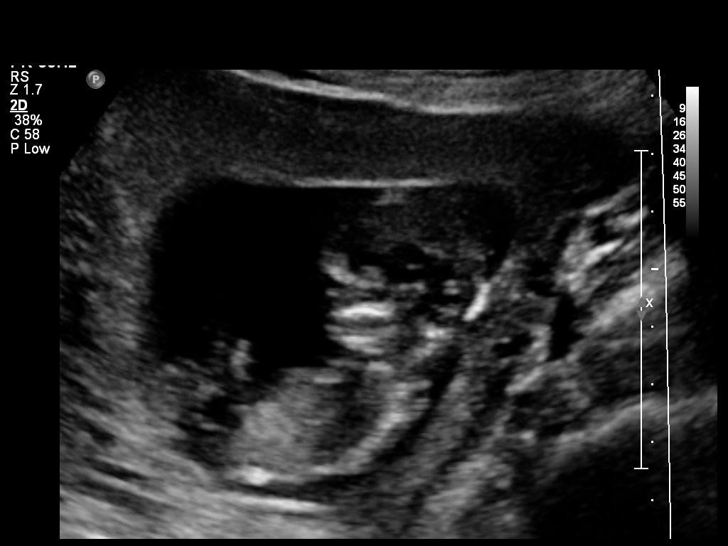
[im 21/21]
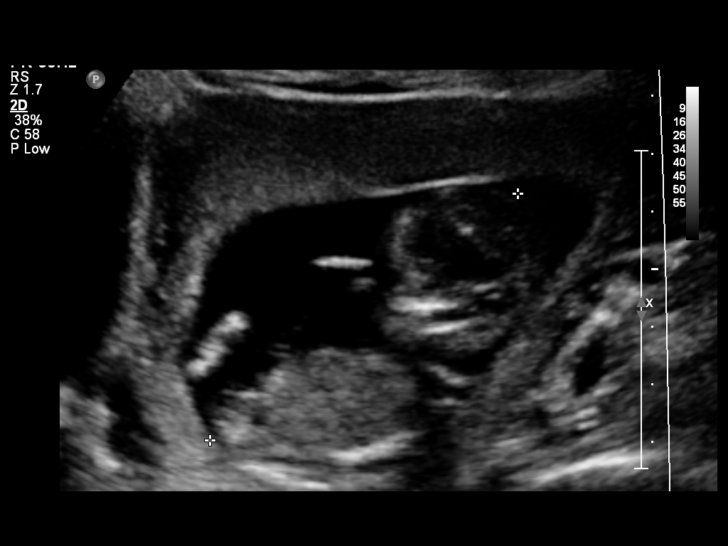

[14 of 21 positions shown; findings below may reference images not displayed]

FINDINGS: Intrauterine gestational sac: Visualized/normal in shape.

Yolk sac:  No

Embryo:  Yes

Cardiac Activity: Yes

Heart Rate: 169 bpm

CRL:   6.68 cm   13 w 0 d                  US EDC: 07/24/2014

Maternal uterus/adnexae: No subchorionic hemorrhage is seen. The
uterus is otherwise unremarkable in appearance.

The ovaries are within normal limits. The right ovary measures 2.9 x
1.8 x 1.7 cm, while the left ovary measures 2.5 x 1.1 x 1.8 cm. No
suspicious adnexal masses are seen; there is no evidence for ovarian
torsion.

No free fluid is seen within the pelvic cul-de-sac.
IMPRESSION: Single live intrauterine pregnancy noted, with a crown-rump length
of 6.7 cm, corresponding to a gestational age of 13 weeks 0 days.
This reflects an estimated date of delivery July 24, 2014.

## 2016-08-28 ENCOUNTER — Ambulatory Visit (INDEPENDENT_AMBULATORY_CARE_PROVIDER_SITE_OTHER): Payer: 59 | Admitting: Student

## 2016-08-28 DIAGNOSIS — J111 Influenza due to unidentified influenza virus with other respiratory manifestations: Secondary | ICD-10-CM

## 2016-08-28 MED ORDER — GUAIFENESIN ER 600 MG PO TB12: 600.0000 mg | ORAL_TABLET | Freq: Every day | ORAL | 0 refills | Status: DC | PRN

## 2016-08-28 MED ORDER — GUAIFENESIN-CODEINE 100-10 MG/5ML PO SOLN: 5.0000 mL | Freq: Three times a day (TID) | ORAL | 0 refills | Status: DC | PRN

## 2016-08-28 NOTE — Assessment & Plan Note (Signed)
Reviewed supportive care.  Mucinex, tea, cheratussin.  Increased fluids.  Follow up as needed.

## 2016-08-28 NOTE — Progress Notes (Signed)
   Subjective:    Patient ID: [name], female    DOB: 12/21/1985, 31 y.o.   MRN: [id number]  HPI  Presents with 4 day history of cough, congestion.  Just started having chills and myalgias.  Denies earache, sore throat, shortness of breath, chest pain, nausea, vomiting, diarrhea.  Her husband was sick and she believes she caught it from him.  Her cough keeps her up at night.      Review of Systems  Constitutional: Positive for chills and fatigue. Negative for fever.  HENT: Positive for congestion. Negative for ear discharge, ear pain, rhinorrhea and sore throat.   Respiratory: Positive for cough. Negative for chest tightness, shortness of breath and wheezing.   Cardiovascular: Negative for chest pain, palpitations and leg swelling.  Gastrointestinal: Negative for abdominal pain, diarrhea and nausea.  Genitourinary: Negative for dysuria and urgency.  Musculoskeletal: Positive for myalgias. Negative for arthralgias and joint swelling.  Skin: Negative for rash and wound.  Psychiatric/Behavioral: Negative for agitation and confusion.  All other systems reviewed and are negative.      Objective:   Physical Exam  Constitutional: She is oriented to person, place, and time. She appears well-developed and well-nourished. No distress.  Appears malaised  HENT:  Head: Normocephalic and atraumatic.  Right Ear: External ear normal.  Left Ear: External ear normal.  Mouth/Throat: Oropharynx is clear and moist. No oropharyngeal exudate.  Bilateral edematous nasal turbinates with clear discharge  Neck: Normal range of motion. Neck supple.  Left small cervical lymphadenopathy, posterior  Cardiovascular: Normal rate, regular rhythm, normal heart sounds and intact distal pulses.  Exam reveals no gallop and no friction rub.   No murmur heard. Pulmonary/Chest: Effort normal and breath sounds normal. No respiratory distress. She has no wheezes. She has no rales. She exhibits no tenderness.    Musculoskeletal: Normal range of motion. She exhibits no edema.  Lymphadenopathy:    She has cervical adenopathy.  Neurological: She is alert and oriented to person, place, and time.  Skin: Skin is warm. No rash noted. She is not diaphoretic. No erythema.  Psychiatric: She has a normal mood and affect. Her behavior is normal. Judgment and thought content normal.  Nursing note and vitals reviewed.  BP 122/72 (BP Location: Right Arm, Patient Position: Sitting, Cuff Size: Normal)   Pulse 100   Temp 98.3 F (36.8 C) (Oral)   Resp 17   Ht 5' 4.5" (1.638 m)   Wt 160 lb (72.6 kg)   LMP 08/25/2016 (Approximate)   SpO2 100%   BMI 27.04 kg/m         Assessment & Plan:  Influenza Reviewed supportive care.  Mucinex, tea, cheratussin.  Increased fluids.  Follow up as needed.  Signed,  Corliss MarcusAlicia Barnes, DO  Sports Medicine [facility] 5:03 PM 08/29/16

## 2016-08-28 NOTE — Patient Instructions (Addendum)
Take mucinex during day and cheratussin at night to help with cough.    Use saline nasal spray in morning and at night.    Follow up if not improving, but this will get worse before it becomes better.    IF you received an x-ray today, you will receive an invoice from [facility]. Please contact [facility] at [phone] with questions or concerns regarding your invoice.   IF you received labwork today, you will receive an invoice from [facility]. Please contact [facility] at [phone] with questions or concerns regarding your invoice.   Our billing staff will not be able to assist you with questions regarding bills from these companies.  You will be contacted with the lab results as soon as they are available. The fastest way to get your results is to activate your My Chart account. Instructions are located on the last page of this paperwork. If you have not heard from us regarding the results in 2 weeks, please contact this office.

## 2016-08-29 IMAGING — US US OB FOLLOW-UP
1 series · 12 of 28 positions shown · non-contrast
Comparison: none

[Series 1: us ob follow-up · 0.23mm/px · 12 of 44 slices shown]
[im 2/44]
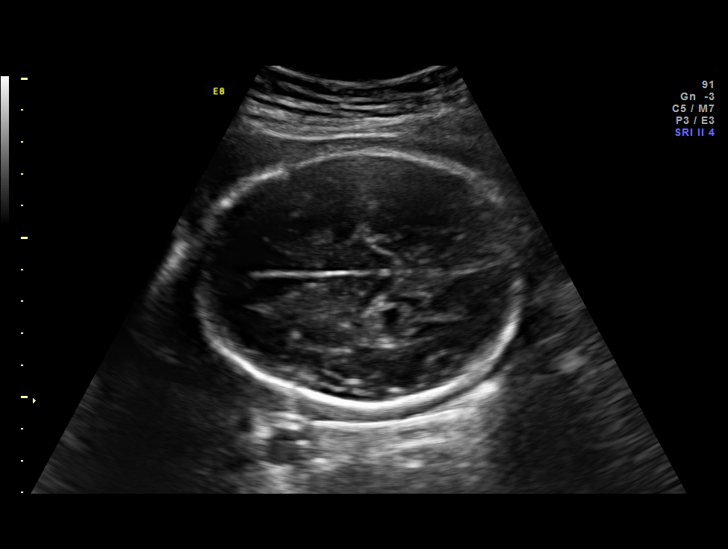
[im 5/44]
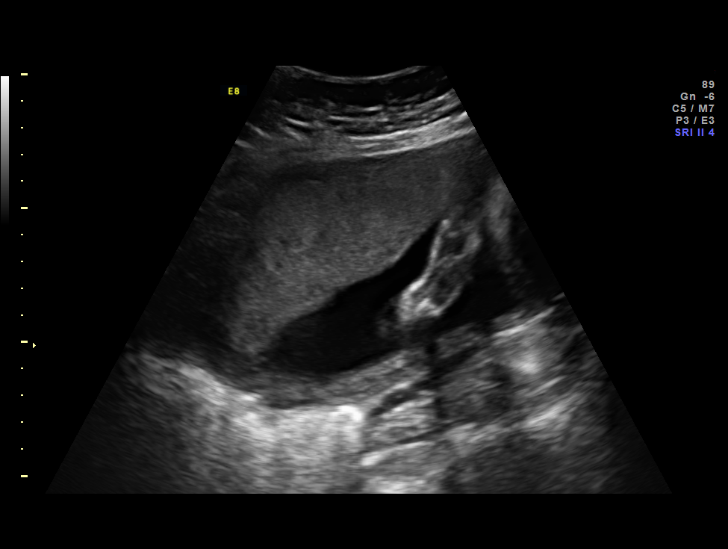
[im 8/44]
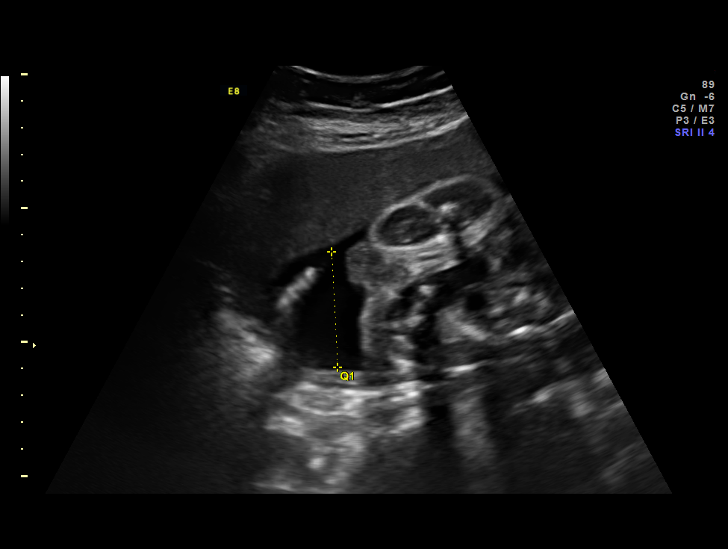
[im 13/44]
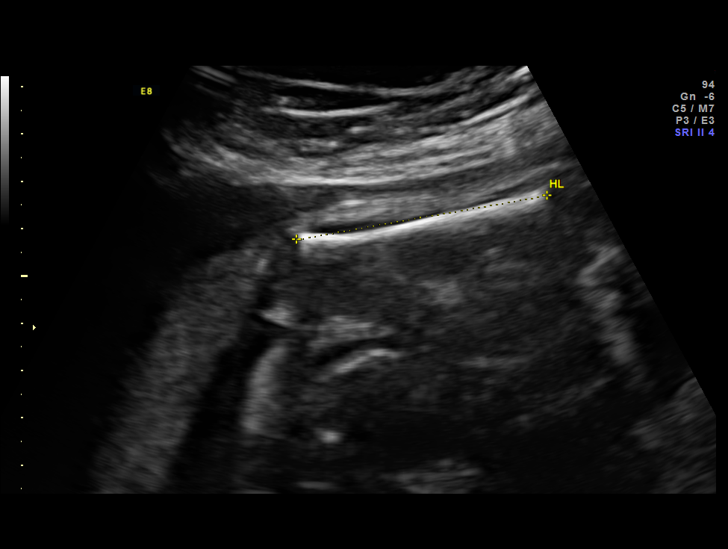
[im 16/44]
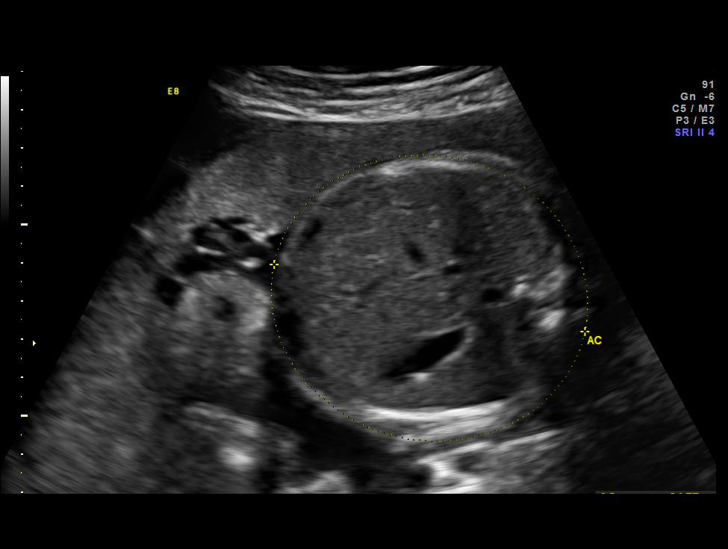
[im 20/44]
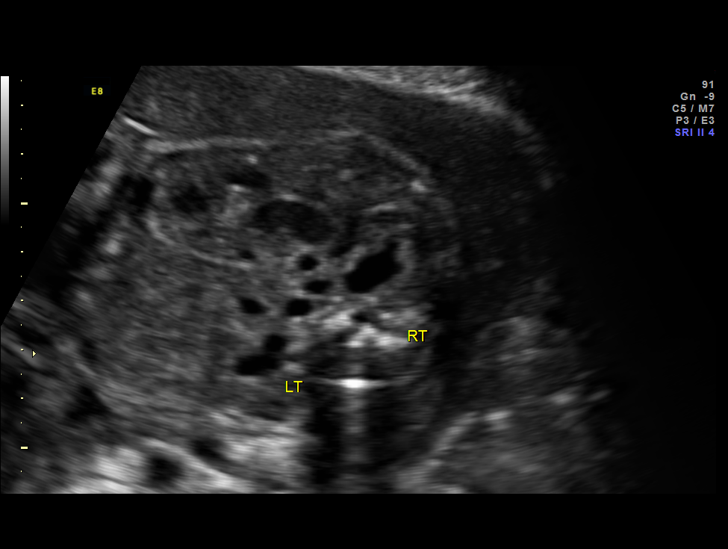
[im 24/44]
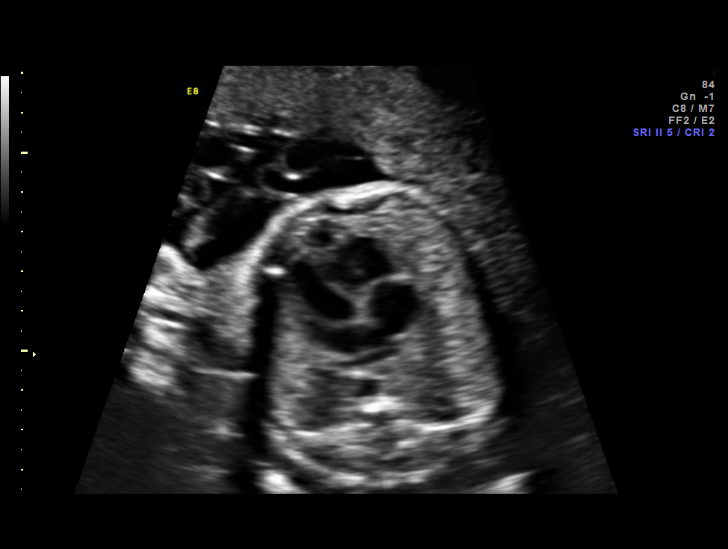
[im 28/44]
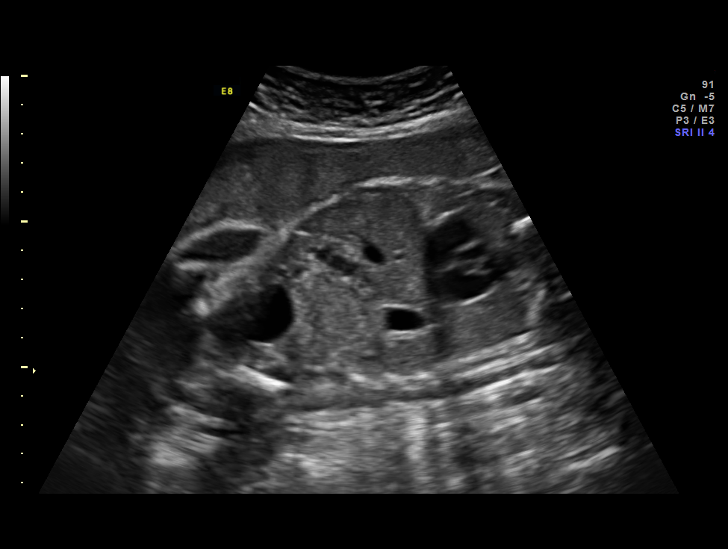
[im 31/44]
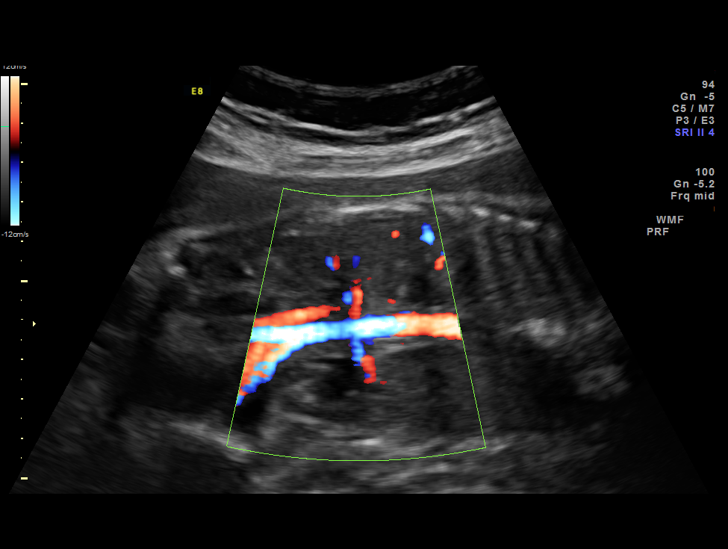
[im 36/44]
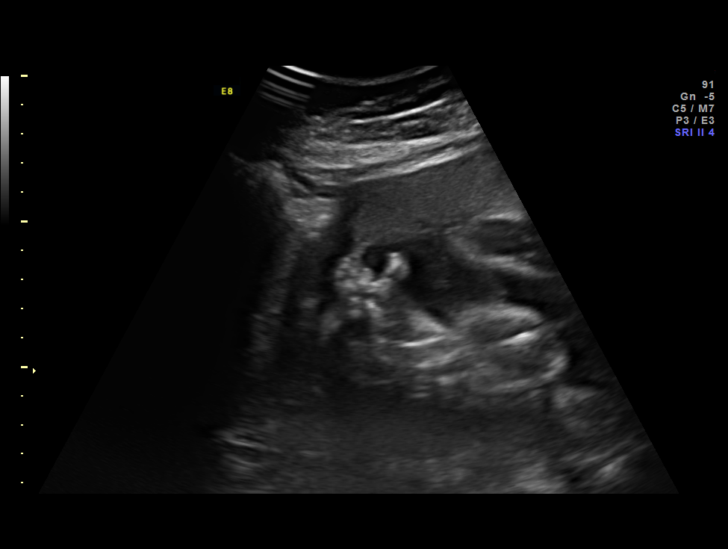
[im 39/44]
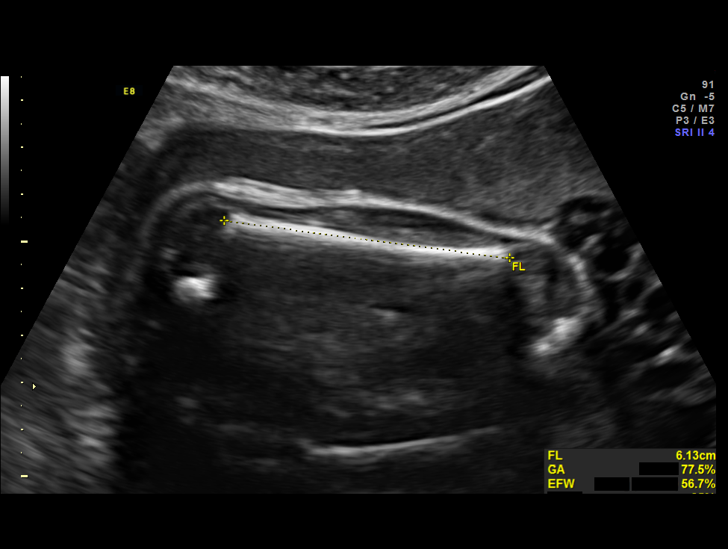
[im 42/44]
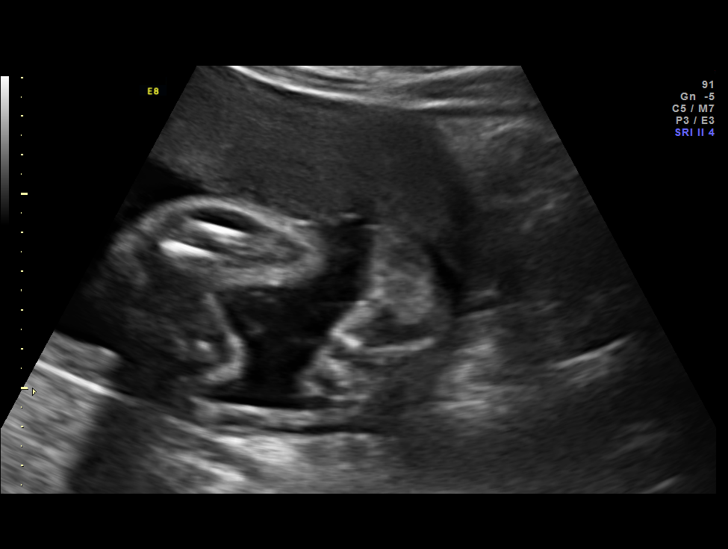

[12 of 28 positions shown; findings below may reference images not displayed]

OBSTETRICS REPORT
                      (Signed Final 05/17/2014 [DATE])

Service(s) Provided

 US OB FOLLOW UP                                       76816.1
Indications

 Consanguinity
 Fetal abnormality - other known or suspected (club
 feet)
Fetal Evaluation

 Num Of Fetuses:    1
 Fetal Heart Rate:  138                          bpm
 Cardiac Activity:  Observed
 Presentation:      Cephalic
 Placenta:          Anterior, above cervical os
 P. Cord            Previously Visualized
 Insertion:

 Amniotic Fluid
 AFI FV:      Subjectively within normal limits
 AFI Sum:     11.95   cm       29  %Tile     Larg Pckt:    4.32  cm
 RUQ:   4.32    cm   RLQ:    1.92   cm    LUQ:   2.31    cm   LLQ:    3.4    cm
Biometry

 BPD:     79.4  mm     G. Age:  31w 6d                CI:         73.3   70 - 86
 OFD:    108.3  mm                                    FL/HC:      20.4   19.2 -

 HC:     299.9  mm     G. Age:  33w 2d       90  %    HC/AC:      1.21   0.99 -

 AC:     247.8  mm     G. Age:  29w 0d       14  %    FL/BPD:     77.0   71 - 87
 FL:      61.1  mm     G. Age:  31w 5d       76  %    FL/AC:      24.7   20 - 24
 HUM:     53.8  mm     G. Age:  31w 2d       71  %

 Est. FW:    0922  gm      3 lb 8 oz     58  %
Gestational Age

 U/S Today:     31w 3d                                        EDD:   07/16/14
 Best:          30w 2d     Det. By:  Early Ultrasound         EDD:   07/24/14
                                     (01/16/14)
Anatomy
 Cranium:          Previously seen        Aortic Arch:      Previously seen
 Fetal Cavum:      Previously seen        Ductal Arch:      Previously seen
 Ventricles:       Appears normal         Diaphragm:        Appears normal
 Choroid Plexus:   Previously seen        Stomach:          Appears normal, left
                                                            sided
 Cerebellum:       Previously seen        Abdomen:          Previously seen
 Posterior Fossa:  Previously seen        Abdominal Wall:   Previously seen
 Nuchal Fold:      Previously seen        Cord Vessels:     Previously seen
 Face:             Orbits and profile     Kidneys:          Appear normal
                   previously seen
 Lips:             Previously seen        Bladder:          Appears normal
 Palate:           Previously seen        Spine:            Previously seen
 Heart:            Appears normal         Lower             Clubfeet
                   (4CH, axis, and        Extremities:
                   situs)
 RVOT:             Appears normal         Upper             Previously seen
                                          Extremities:
 LVOT:             Appears normal

 Other:  Male gender. Rt 5th digit previously seen.
Cervix Uterus Adnexa

 Cervix:       Not visualized (advanced GA >83wks)
Impression

 Single IUP at 30w 2d
 Follow up due to bilateral clubfeet.  Karyotype normal by
 amniocentesis.
 Bilateral clubfeet again noted
 The fetal kidneys appear normal - the right renal pelvis
 measure 4.7 mm; the left 7 mm (essentially physiologic for
 this gestational age)
 Fetal growth is appropriate (58th %tile)
 Normal amniotic fluid volume
Recommendations

 Peds ortho consultation to be scheduled
 Recommend follow up in 4 weeks for growth and reevaluation.

 questions or concerns.

## 2016-11-21 ENCOUNTER — Ambulatory Visit (INDEPENDENT_AMBULATORY_CARE_PROVIDER_SITE_OTHER): Payer: 59 | Admitting: Urgent Care

## 2016-11-21 VITALS — BP 117/75 | HR 89 | Temp 98.6°F | Resp 18 | Ht 64.5 in | Wt 161.6 lb

## 2016-11-21 DIAGNOSIS — R300 Dysuria: Secondary | ICD-10-CM

## 2016-11-21 DIAGNOSIS — N3090 Cystitis, unspecified without hematuria: Secondary | ICD-10-CM

## 2016-11-21 DIAGNOSIS — R3915 Urgency of urination: Secondary | ICD-10-CM | POA: Diagnosis not present

## 2016-11-21 DIAGNOSIS — R350 Frequency of micturition: Secondary | ICD-10-CM

## 2016-11-21 LAB — POCT URINALYSIS DIP (MANUAL ENTRY)
Bilirubin, UA: NEGATIVE
Blood, UA: NEGATIVE
Glucose, UA: NEGATIVE mg/dL
Ketones, POC UA: NEGATIVE mg/dL
Leukocytes, UA: NEGATIVE
NITRITE UA: NEGATIVE
Protein Ur, POC: NEGATIVE mg/dL
Spec Grav, UA: <=1.005 — AB (ref 1.010–1.025)
UROBILINOGEN UA: 0.2 U/dL
pH, UA: 5 (ref 5.0–8.0)

## 2016-11-21 LAB — POC MICROSCOPIC URINALYSIS (UMFC): MUCUS RE: ABSENT

## 2016-11-21 LAB — POCT URINE PREGNANCY: Preg Test, Ur: NEGATIVE

## 2016-11-21 MED ORDER — NITROFURANTOIN MONOHYD MACRO 100 MG PO CAPS: 100.0000 mg | ORAL_CAPSULE | Freq: Two times a day (BID) | ORAL | 0 refills | Status: AC

## 2016-11-21 NOTE — Progress Notes (Signed)
   MRN: [id number] DOB: 1985-09-08  Subjective:   [name] is a 31 y.o. female presenting for chief complaint of Urinary Frequency; dry mouth; and glucose (was 150 last night but fasting 120 and after eating breakfast is was 98 )  Reports 2 week history of urinary frequency, dysuria, urinary urgency, intermittent lower pelvic discomfort. Denies hematuria, cloudy urine, fever, n/v, abdominal pain, vaginal discharge, genital rashes, flank pain. Denies history of diabetes. Denies smoking cigarettes. Drinks ~32 ounces or less of water daily.  T[place]iya currently has no medications in their medication list. Patient is allergic to penicillins cross reactors.  Ngoc  has a past medical history of Allergy; Asthma; and Headache(784.0). Also  has a past surgical history that includes No past surgeries; Hysteroscopy w/D&C (06/02/2012); IUD removal (06/02/2012); and IUD removal.  Objective:   Vitals: BP 117/75   Pulse 89   Temp 98.6 F (37 C) (Oral)   Resp 18   Ht 5' 4.5" (1.638 m)   Wt 161 lb 9.6 oz (73.3 kg)   SpO2 98%   BMI 27.31 kg/m   Physical Exam  Constitutional: She is oriented to person, place, and time. She appears well-developed and well-nourished.  HENT:  Mouth/Throat: Oropharynx is clear and moist.  Cardiovascular: Normal rate, regular rhythm and intact distal pulses.  Exam reveals no gallop and no friction rub.   No murmur heard. Pulmonary/Chest: No respiratory distress. She has no wheezes. She has no rales.  Abdominal: Soft. Bowel sounds are normal. She exhibits no distension and no mass. There is no tenderness. There is no guarding.  Neurological: She is alert and oriented to person, place, and time.  Skin: Skin is warm and dry.   Results for orders placed or performed in visit on 11/21/16 (from the past 24 hour(s))  POCT urinalysis dipstick     Status: Abnormal   Collection Time: 11/21/16  2:04 PM  Result Value Ref Range   Color, UA yellow yellow   Clarity, UA clear  clear   Glucose, UA negative negative mg/dL   Bilirubin, UA negative negative   Ketones, POC UA negative negative mg/dL   Spec Grav, UA <=4.098 (A) 1.010 - 1.025   Blood, UA negative negative   pH, UA 5.0 5.0 - 8.0   Protein Ur, POC negative negative mg/dL   Urobilinogen, UA 0.2 0.2 or 1.0 E.U./dL   Nitrite, UA Negative Negative   Leukocytes, UA Negative Negative  POCT Microscopic Urinalysis ([facility])     Status: Abnormal   Collection Time: 11/21/16  2:13 PM  Result Value Ref Range   WBC,UR,HPF,POC None None WBC/hpf   RBC,UR,HPF,POC None None RBC/hpf   Bacteria None None, Too numerous to count   Mucus Absent Absent   Epithelial Cells, UR Per Microscopy Few (A) None, Too numerous to count cells/hpf  POCT urine pregnancy     Status: None   Collection Time: 11/21/16  2:39 PM  Result Value Ref Range   Preg Test, Ur Negative Negative   Assessment and Plan :   1. Cystitis 2. Urine frequency 3. Urinary urgency 4. Dysuria - Labs and urine culture pending, will manage clinically as cystitis. Patient is to hydrate aggressively. RTC in 1 week if no improvement.  Wallis Bamberg, PA-C [facility] [phone] 11/21/2016 2:34 PM

## 2016-11-21 NOTE — Patient Instructions (Addendum)
Urinary Tract Infection, Adult A urinary tract infection (UTI) is an infection of any part of the urinary tract, which includes the kidneys, ureters, bladder, and urethra. These organs make, store, and get rid of urine in the body. UTI can be a bladder infection (cystitis) or kidney infection (pyelonephritis). What are the causes? This infection may be caused by fungi, viruses, or bacteria. Bacteria are the most common cause of UTIs. This condition can also be caused by repeated incomplete emptying of the bladder during urination. What increases the risk? This condition is more likely to develop if:  You ignore your need to urinate or hold urine for long periods of time.  You do not empty your bladder completely during urination.  You wipe back to front after urinating or having a bowel movement, if you are female.  You are uncircumcised, if you are female.  You are constipated.  You have a urinary catheter that stays in place (indwelling).  You have a weak defense (immune) system.  You have a medical condition that affects your bowels, kidneys, or bladder.  You have diabetes.  You take antibiotic medicines frequently or for long periods of time, and the antibiotics no longer work well against certain types of infections (antibiotic resistance).  You take medicines that irritate your urinary tract.  You are exposed to chemicals that irritate your urinary tract.  You are female. What are the signs or symptoms? Symptoms of this condition include:  Fever.  Frequent urination or passing small amounts of urine frequently.  Needing to urinate urgently.  Pain or burning with urination.  Urine that smells bad or unusual.  Cloudy urine.  Pain in the lower abdomen or back.  Trouble urinating.  Blood in the urine.  Vomiting or being less hungry than normal.  Diarrhea or abdominal pain.  Vaginal discharge, if you are female. How is this diagnosed? This condition is  diagnosed with a medical history and physical exam. You will also need to provide a urine sample to test your urine. Other tests may be done, including:  Blood tests.  Sexually transmitted disease (STD) testing. If you have had more than one UTI, a cystoscopy or imaging studies may be done to determine the cause of the infections. How is this treated? Treatment for this condition often includes a combination of two or more of the following:  Antibiotic medicine.  Other medicines to treat less common causes of UTI.  Over-the-counter medicines to treat pain.  Drinking enough water to stay hydrated. Follow these instructions at home:  Take over-the-counter and prescription medicines only as told by your health care provider.  If you were prescribed an antibiotic, take it as told by your health care provider. Do not stop taking the antibiotic even if you start to feel better.  Avoid alcohol, caffeine, tea, and carbonated beverages. They can irritate your bladder.  Drink enough fluid to keep your urine clear or pale yellow.  Keep all follow-up visits as told by your health care provider. This is important.  Make sure to:  Empty your bladder often and completely. Do not hold urine for long periods of time.  Empty your bladder before and after sex.  Wipe from front to back after a bowel movement if you are female. Use each tissue one time when you wipe. Contact a health care provider if:  You have back pain.  You have a fever.  You feel nauseous or vomit.  Your symptoms do not get better after 3   days.  Your symptoms go away and then return. Get help right away if:  You have severe back pain or lower abdominal pain.  You are vomiting and cannot keep down any medicines or water. This information is not intended to replace advice given to you by your health care provider. Make sure you discuss any questions you have with your health care provider. Document Released:  04/25/2005 Document Revised: 12/28/2015 Document Reviewed: 06/06/2015 Elsevier Interactive Patient Education  2017 [employer]     IF you received an x-ray today, you will receive an invoice from [facility]. Please contact [facility] at [phone] with questions or concerns regarding your invoice.   IF you received labwork today, you will receive an invoice from [facility]. Please contact [facility] at [phone] with questions or concerns regarding your invoice.   Our billing staff will not be able to assist you with questions regarding bills from these companies.  You will be contacted with the lab results as soon as they are available. The fastest way to get your results is to activate your My Chart account. Instructions are located on the last page of this paperwork. If you have not heard from us regarding the results in 2 weeks, please contact this office.      

## 2016-11-22 ENCOUNTER — Encounter: Payer: Self-pay | Admitting: Urgent Care

## 2016-11-22 LAB — BASIC METABOLIC PANEL
BUN/Creatinine Ratio: 19 (ref 9–23)
BUN: 12 mg/dL (ref 6–20)
CALCIUM: 9.5 mg/dL (ref 8.7–10.2)
CO2: 25 mmol/L (ref 18–29)
CREATININE: 0.64 mg/dL (ref 0.57–1.00)
Chloride: 100 mmol/L (ref 96–106)
GFR, EST AFRICAN AMERICAN: 139 mL/min/{1.73_m2} (ref >59)
GFR, EST NON AFRICAN AMERICAN: 121 mL/min/{1.73_m2} (ref >59)
Glucose: 91 mg/dL (ref 65–99)
Potassium: 4.3 mmol/L (ref 3.5–5.2)
Sodium: 138 mmol/L (ref 134–144)

## 2016-11-22 LAB — HEMOGLOBIN A1C
Est. average glucose Bld gHb Est-mCnc: 111 mg/dL
Hgb A1c MFr Bld: 5.5 % (ref 4.8–5.6)

## 2016-11-23 LAB — URINE CULTURE

## 2017-01-24 ENCOUNTER — Ambulatory Visit: Payer: 59 | Admitting: Physician Assistant

## 2018-07-19 ENCOUNTER — Ambulatory Visit: Payer: 59 | Admitting: Emergency Medicine

## 2019-11-02 ENCOUNTER — Emergency Department (HOSPITAL_COMMUNITY): Payer: 59

## 2019-11-02 ENCOUNTER — Emergency Department (HOSPITAL_COMMUNITY)
Admission: EM | Admit: 2019-11-02 | Discharge: 2019-11-02 | Disposition: A | Discharge status: Home / Self Care | Payer: 59 | Attending: Emergency Medicine | Admitting: Emergency Medicine

## 2019-11-02 ENCOUNTER — Other Ambulatory Visit: Payer: Self-pay

## 2019-11-02 ENCOUNTER — Encounter (HOSPITAL_COMMUNITY): Payer: Self-pay | Admitting: Emergency Medicine

## 2019-11-02 DIAGNOSIS — R109 Unspecified abdominal pain: Secondary | ICD-10-CM | POA: Diagnosis present

## 2019-11-02 DIAGNOSIS — J45909 Unspecified asthma, uncomplicated: Secondary | ICD-10-CM | POA: Insufficient documentation

## 2019-11-02 DIAGNOSIS — N132 Hydronephrosis with renal and ureteral calculous obstruction: Secondary | ICD-10-CM | POA: Insufficient documentation

## 2019-11-02 DIAGNOSIS — R339 Retention of urine, unspecified: Secondary | ICD-10-CM | POA: Diagnosis not present

## 2019-11-02 LAB — CBC WITH DIFFERENTIAL/PLATELET
Abs Immature Granulocytes: 0.02 10*3/uL (ref 0.00–0.07)
Basophils Absolute: 0 10*3/uL (ref 0.0–0.1)
Basophils Relative: 0 %
Eosinophils Absolute: 0 10*3/uL (ref 0.0–0.5)
Eosinophils Relative: 0 %
HCT: 42.4 % (ref 36.0–46.0)
Hemoglobin: 13.1 g/dL (ref 12.0–15.0)
Immature Granulocytes: 0 %
Lymphocytes Relative: 11 %
Lymphs Abs: 0.8 10*3/uL (ref 0.7–4.0)
MCH: 26.2 pg (ref 26.0–34.0)
MCHC: 30.9 g/dL (ref 30.0–36.0)
MCV: 84.8 fL (ref 80.0–100.0)
Monocytes Absolute: 0.3 10*3/uL (ref 0.1–1.0)
Monocytes Relative: 5 %
Neutro Abs: 5.9 10*3/uL (ref 1.7–7.7)
Neutrophils Relative %: 84 %
Platelets: 153 10*3/uL (ref 150–400)
RBC: 5 MIL/uL (ref 3.87–5.11)
RDW: 13.2 % (ref 11.5–15.5)
WBC: 7.1 10*3/uL (ref 4.0–10.5)
nRBC: 0 % (ref 0.0–0.2)

## 2019-11-02 LAB — COMPREHENSIVE METABOLIC PANEL
ALT: 13 U/L (ref 0–44)
AST: 16 U/L (ref 15–41)
Albumin: 3.5 g/dL (ref 3.5–5.0)
Alkaline Phosphatase: 71 U/L (ref 38–126)
Anion gap: 7 (ref 5–15)
BUN: 12 mg/dL (ref 6–20)
CO2: 24 mmol/L (ref 22–32)
Calcium: 8.3 mg/dL — ABNORMAL LOW (ref 8.9–10.3)
Chloride: 106 mmol/L (ref 98–111)
Creatinine, Ser: 0.68 mg/dL (ref 0.44–1.00)
GFR calc Af Amer: >60 mL/min (ref >60)
GFR calc non Af Amer: >60 mL/min (ref >60)
Glucose, Bld: 114 mg/dL — ABNORMAL HIGH (ref 70–99)
Potassium: 3.5 mmol/L (ref 3.5–5.1)
Sodium: 137 mmol/L (ref 135–145)
Total Bilirubin: 0.5 mg/dL (ref 0.3–1.2)
Total Protein: 7.3 g/dL (ref 6.5–8.1)

## 2019-11-02 LAB — URINALYSIS, ROUTINE W REFLEX MICROSCOPIC
Bilirubin Urine: NEGATIVE
Glucose, UA: NEGATIVE mg/dL
Ketones, ur: 5 mg/dL — AB
Leukocytes,Ua: NEGATIVE
Nitrite: NEGATIVE
Protein, ur: NEGATIVE mg/dL
Specific Gravity, Urine: 1.028 (ref 1.005–1.030)
pH: 5 (ref 5.0–8.0)

## 2019-11-02 LAB — HCG, QUANTITATIVE, PREGNANCY: hCG, Beta Chain, Quant, S: <1 m[IU]/mL (ref <5)

## 2019-11-02 LAB — LIPASE, BLOOD: Lipase: 21 U/L (ref 11–51)

## 2019-11-02 MED ORDER — KETOROLAC TROMETHAMINE 30 MG/ML IJ SOLN
30.0000 mg | Freq: Once | INTRAMUSCULAR | Status: AC
  Administered 2019-11-02: 30 mg via INTRAVENOUS
  Filled 2019-11-02: qty 1

## 2019-11-02 MED ORDER — ONDANSETRON HCL 4 MG/2ML IJ SOLN
4.0000 mg | Freq: Once | INTRAMUSCULAR | Status: AC
  Administered 2019-11-02: 4 mg via INTRAVENOUS
  Filled 2019-11-02: qty 2

## 2019-11-02 MED ORDER — TAMSULOSIN HCL 0.4 MG PO CAPS: 0.4000 mg | ORAL_CAPSULE | Freq: Every day | ORAL | 0 refills | Status: AC

## 2019-11-02 MED ORDER — MORPHINE SULFATE (PF) 4 MG/ML IV SOLN
4.0000 mg | Freq: Once | INTRAVENOUS | Status: AC
  Administered 2019-11-02: 4 mg via INTRAVENOUS
  Filled 2019-11-02: qty 1

## 2019-11-02 MED ORDER — ONDANSETRON 4 MG PO TBDP: 4.0000 mg | ORAL_TABLET | Freq: Three times a day (TID) | ORAL | 0 refills | Status: AC | PRN

## 2019-11-02 MED ORDER — SODIUM CHLORIDE 0.9 % IV BOLUS (SEPSIS)
1000.0000 mL | Freq: Once | INTRAVENOUS | Status: AC
  Administered 2019-11-02: 1000 mL via INTRAVENOUS

## 2019-11-02 NOTE — ED Provider Notes (Signed)
TIME SEEN: 5:27 AM  CHIEF COMPLAINT: Right flank pain  HPI: Patient is a 34 year old female with history of asthma who presents to the emergency department with right flank pain that woke her from sleep 2 hours prior to arrival.  Never had similar symptoms before.  No history of kidney stones.  States she feels like she needs to urinate but cannot.  No dysuria or hematuria.  No vaginal discharge.  No nausea, vomiting or diarrhea.  No fever.  Reports her husband drove her here to the emergency department.  ROS: See HPI, limited as patient is a poor historian Constitutional: no fever  Eyes: no drainage  ENT: no runny nose   Cardiovascular:  no chest pain  Resp: no SOB  GI: no vomiting GU: no dysuria Integumentary: no rash  Allergy: no hives  Musculoskeletal: no leg swelling  Neurological: no slurred speech ROS otherwise negative  PAST MEDICAL HISTORY/PAST SURGICAL HISTORY:  Past Medical History:  Diagnosis Date  . Allergy   . Asthma   . Headache(784.0)     MEDICATIONS:  Prior to Admission medications   Medication Sig Start Date End Date Taking? Authorizing Provider  nitrofurantoin, macrocrystal-monohydrate, (MACROBID) 100 MG capsule Take 1 capsule (100 mg total) by mouth 2 (two) times daily. 11/21/16   Wallis Bamberg, PA-C    ALLERGIES:  Allergies  Allergen Reactions  . Penicillins Cross Reactors Shortness Of Breath    SOCIAL HISTORY:  Social History   Tobacco Use  . Smoking status: Never Smoker  . Smokeless tobacco: Never Used  Substance Use Topics  . Alcohol use: No    FAMILY HISTORY: History reviewed. No pertinent family history.  EXAM: BP (!) 130/95 (BP Location: Right Arm)   Pulse 100   Temp 98.1 F (36.7 C) (Oral)   Resp 18   Ht 5\' 3"  (1.6 m)   Wt 72.6 kg   LMP 11/01/2019   SpO2 100%   BMI 28.34 kg/m  CONSTITUTIONAL: Alert and oriented and responds appropriately to questions only intermittently.  Appears uncomfortable.  Moaning in pain.  Poor  historian. HEAD: Normocephalic EYES: Conjunctivae clear, pupils appear equal, EOM appear intact ENT: normal nose; moist mucous membranes NECK: Supple, normal ROM CARD: RRR; S1 and S2 appreciated; no murmurs, no clicks, no rubs, no gallops RESP: Normal chest excursion without splinting or tachypnea; breath sounds clear and equal bilaterally; no wheezes, no rhonchi, no rales, no hypoxia or respiratory distress, speaking full sentences ABD/GI: Normal bowel sounds; non-distended; soft, tender in the right upper quadrant and diffusely throughout the abdomen with negative Murphy sign, no significant tenderness at McBurney's point BACK:  The back appears normal, patient has right CVA tenderness EXT: Normal ROM in all joints; no deformity noted, no edema; no cyanosis SKIN: Normal color for age and race; warm; no rash on exposed skin NEURO: Moves all extremities equally PSYCH: The patient's mood and manner are appropriate.   MEDICAL DECISION MAKING: Patient here with sudden onset right flank pain that radiates into the right side of her abdomen.  Differential includes kidney stone, pyelonephritis, cholelithiasis, cholecystitis, pancreatitis, choledocholithiasis, appendicitis, UTI.  Will obtain labs, urine and CT of the abdomen pelvis.  Will give IV fluids, pain and nausea medicine for symptomatic relief.  ED PROGRESS: Labs independently reviewed and interpreted by myself.  No significant abnormality.  No leukocytosis.  Normal LFTs and lipase.  Urine, CT of the abdomen pelvis pending.  Signed out to oncoming ED team to follow-up on these results.  I reviewed  all nursing notes and pertinent previous records as available.  I have interpreted any EKGs, lab and urine results, imaging (as available).    [name] was evaluated in Emergency Department on 11/02/2019 for the symptoms described in the history of present illness. She was evaluated in the context of the global COVID-19 pandemic, which  necessitated consideration that the patient might be at risk for infection with the SARS-CoV-2 virus that causes COVID-19. Institutional protocols and algorithms that pertain to the evaluation of patients at risk for COVID-19 are in a state of rapid change based on information released by regulatory bodies including the CDC and federal and state organizations. These policies and algorithms were followed during the patient's care in the ED.      Adrian Dinovo, Delice Bison, DO 11/02/19 [phone]

## 2019-11-02 NOTE — ED Provider Notes (Signed)
Care assumed from Dr. Baxter Hire Ward, DO at shift change, please see their notes for full documentation of patient's complaint/HPI. Briefly, pt here with sudden onset right flank pain 2 hours PTA. Results so far show CBC without leukocytosis, hgb stable. CMP with glucose 114 and calcium 8.3, no other abnormalities. Lipase 21. Awaiting U/A, pregnancy test, and CT renal stone study. Plan is to dispo accordingly. If no acute findings today will be discharged home with pain control including Ibuprofen/Tylenol and PCP follow up.   Physical Exam  BP (!) 130/95 (BP Location: Right Arm)   Pulse 100   Temp 98.1 F (36.7 C) (Oral)   Resp 18   Ht 5\' 3"  (1.6 m)   Wt 72.6 kg   LMP 11/01/2019   SpO2 100%   BMI 28.34 kg/m   Physical Exam  ED Course/Procedures     Procedures  MDM  CT scan with findings of 3 mm ureteral stone with hydronephrosis. Creatinine stable at 0.68. U/A without signs of infection today.   On reeval pt resting comfortably. Reports pain has subsided with medications. Will discharge home at this time with urology follow up. Pt's husband requesting strainer at this time - will give at time of discharge. Have recommended increasing fluid intake to stay hydrated. Have discharged home with flomax and zofran. Advised Ibuprofen and Tylenol as needed for pain. Strict return precautions have been discussed. Pt is in agreement with plan and stable for discharge home.   This note was prepared using Dragon voice recognition software and may include unintentional dictation errors due to the inherent limitations of voice recognition software.       01/01/2020, PA-C 11/02/19 [id number]    Ward, 01/02/20, DO 11/02/19 [id number]

## 2019-11-02 NOTE — ED Triage Notes (Signed)
Pt reports having right sided abdominal pain that started 2 hours ago. Pt tearful at time of triage.

## 2019-11-02 NOTE — Discharge Instructions (Addendum)
Please follow up with [facility] for follow up purposes.  Drink plenty of fluids to stay hydrated. Take the Flomax as prescribed to help you urinate as well.  Use the Zofran as needed for nausea.  I would recommend 600 mg Ibuprofen every 6-8 hours as needed for pain and 1,000 mg Tylenol every 8 hours as needed for pain Return to the ED for any worsening symptoms including worsening pain, excessive vomiting, fevers > 100.4, chills, burning when you urinate, inability to urinate

## 2021-01-07 ENCOUNTER — Other Ambulatory Visit: Payer: Self-pay

## 2021-01-07 ENCOUNTER — Emergency Department (HOSPITAL_COMMUNITY)
Admission: EM | Admit: 2021-01-07 | Discharge: 2021-01-07 | Disposition: A | Discharge status: Home / Self Care | Payer: 59 | Attending: Emergency Medicine | Admitting: Emergency Medicine

## 2021-01-07 DIAGNOSIS — R1013 Epigastric pain: Secondary | ICD-10-CM | POA: Insufficient documentation

## 2021-01-07 DIAGNOSIS — Z5321 Procedure and treatment not carried out due to patient leaving prior to being seen by health care provider: Secondary | ICD-10-CM | POA: Diagnosis not present

## 2021-01-07 DIAGNOSIS — R0602 Shortness of breath: Secondary | ICD-10-CM | POA: Insufficient documentation

## 2021-01-07 LAB — COMPREHENSIVE METABOLIC PANEL
ALT: 14 U/L (ref 0–44)
AST: 14 U/L — ABNORMAL LOW (ref 15–41)
Albumin: 4 g/dL (ref 3.5–5.0)
Alkaline Phosphatase: 63 U/L (ref 38–126)
Anion gap: 7 (ref 5–15)
BUN: 13 mg/dL (ref 6–20)
CO2: 26 mmol/L (ref 22–32)
Calcium: 9 mg/dL (ref 8.9–10.3)
Chloride: 107 mmol/L (ref 98–111)
Creatinine, Ser: 0.75 mg/dL (ref 0.44–1.00)
GFR, Estimated: >60 mL/min (ref >60)
Glucose, Bld: 123 mg/dL — ABNORMAL HIGH (ref 70–99)
Potassium: 3.8 mmol/L (ref 3.5–5.1)
Sodium: 140 mmol/L (ref 135–145)
Total Bilirubin: 0.4 mg/dL (ref 0.3–1.2)
Total Protein: 7.6 g/dL (ref 6.5–8.1)

## 2021-01-07 LAB — CBC
HCT: 38.7 % (ref 36.0–46.0)
Hemoglobin: 12.5 g/dL (ref 12.0–15.0)
MCH: 27.1 pg (ref 26.0–34.0)
MCHC: 32.3 g/dL (ref 30.0–36.0)
MCV: 83.8 fL (ref 80.0–100.0)
Platelets: 208 10*3/uL (ref 150–400)
RBC: 4.62 MIL/uL (ref 3.87–5.11)
RDW: 13.3 % (ref 11.5–15.5)
WBC: 12 10*3/uL — ABNORMAL HIGH (ref 4.0–10.5)
nRBC: 0 % (ref 0.0–0.2)

## 2021-01-07 LAB — I-STAT BETA HCG BLOOD, ED (MC, WL, AP ONLY): I-stat hCG, quantitative: <5 m[IU]/mL (ref <5)

## 2021-01-07 LAB — LIPASE, BLOOD: Lipase: 26 U/L (ref 11–51)

## 2021-01-07 NOTE — ED Notes (Signed)
Pt arms cleansed with NS and wrapped with Gauze and non adherent dressing

## 2021-01-07 NOTE — ED Triage Notes (Signed)
Pt came in with epigastric pain that started approx one hour ago. Pt also states SOB but she has intermittent moments of crying heavily. Pt has multiple cuts to L arm. Pt denies SI, but states she was trying to numb pain associated with her family being of a different culture.

## 2021-01-07 NOTE — ED Triage Notes (Signed)
Arabic interpreter used for triage

## 2022-02-14 IMAGING — CT CT RENAL STONE PROTOCOL
2 of 4 series · 17 of 46 positions shown, 19 images · non-contrast
Comparison: None.

CLINICAL DATA: Flank pain with kidney stone suspected

EXAM:
CT ABDOMEN AND PELVIS WITHOUT CONTRAST
TECHNIQUE: Multidetector CT imaging of the abdomen and pelvis was performed
following the standard protocol without IV contrast.

[Series 3: axial st · axial · 0.67mm/px · z∈[-393,-8]mm · 14 of 87 slices shown, 16 images]
[im 5/87  soft-tissue]
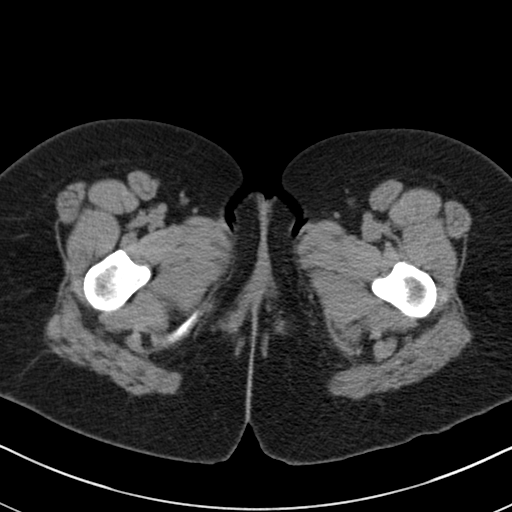
[im 5/87  bone]
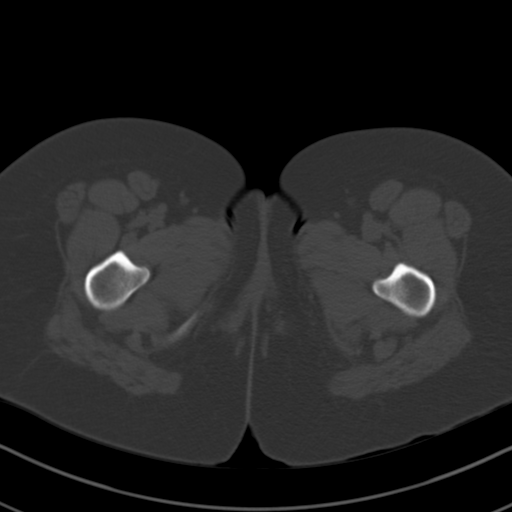
[im 13/87  soft-tissue]
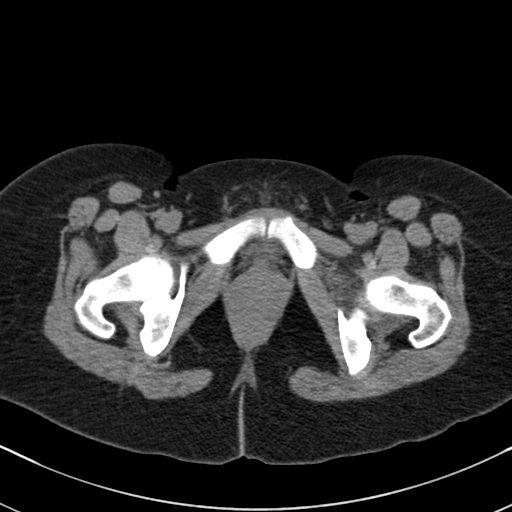
[im 17/87  soft-tissue]
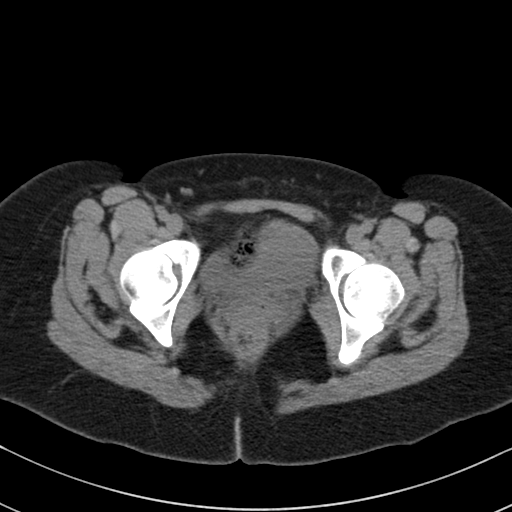
[im 25/87  soft-tissue]
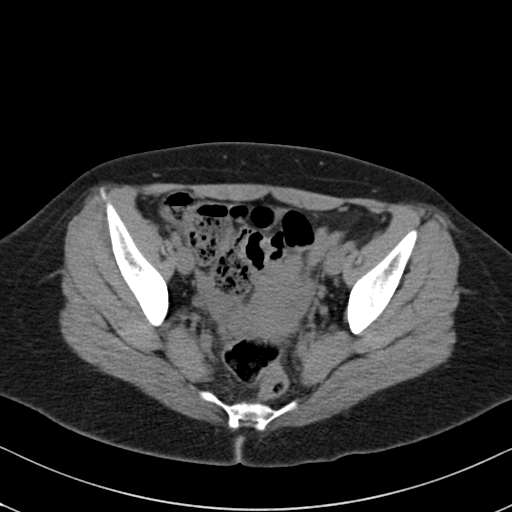
[im 29/87  soft-tissue]
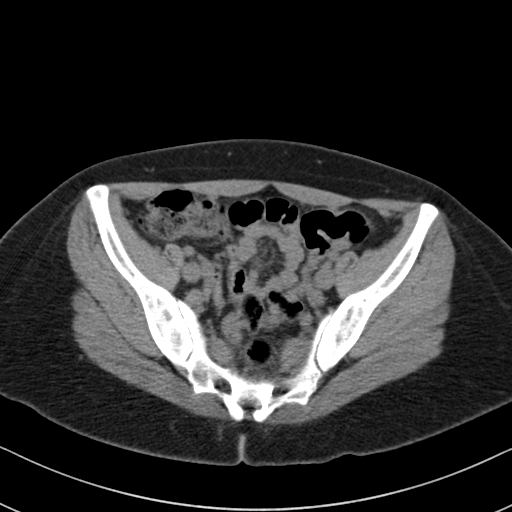
[im 33/87  soft-tissue]
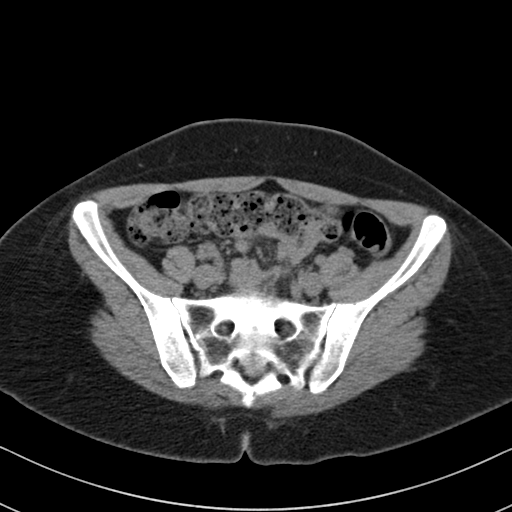
[im 41/87  soft-tissue]
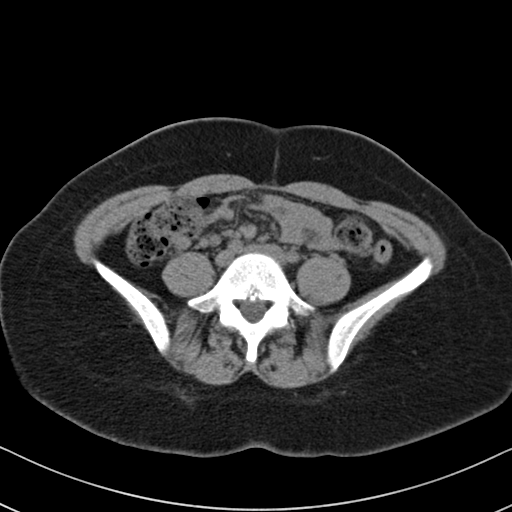
[im 46/87  soft-tissue]
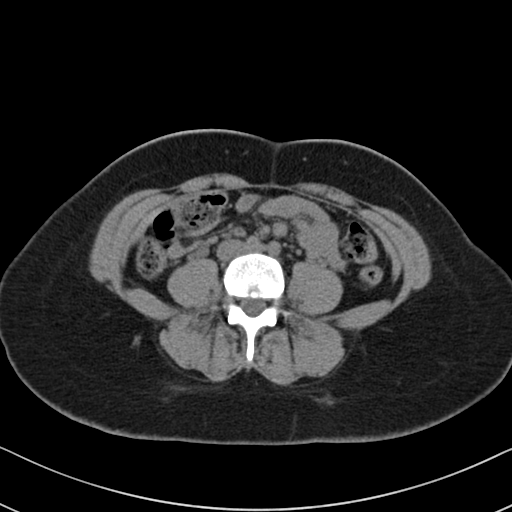
[im 54/87  soft-tissue]
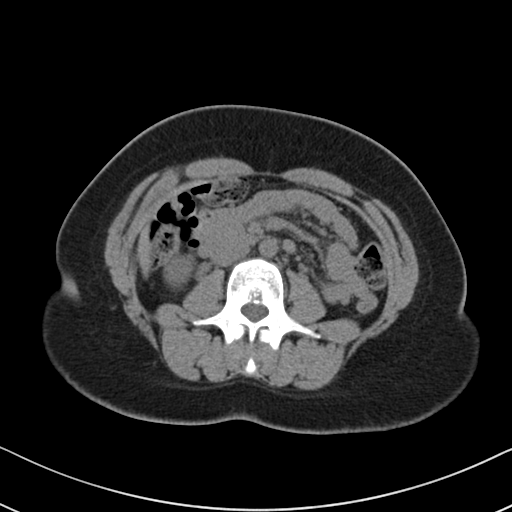
[im 54/87  bone]
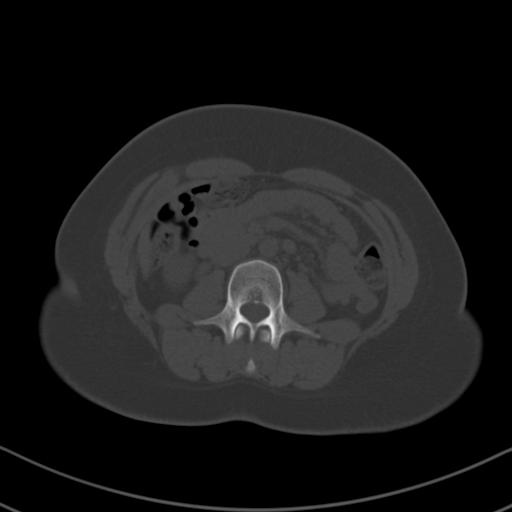
[im 58/87  soft-tissue]
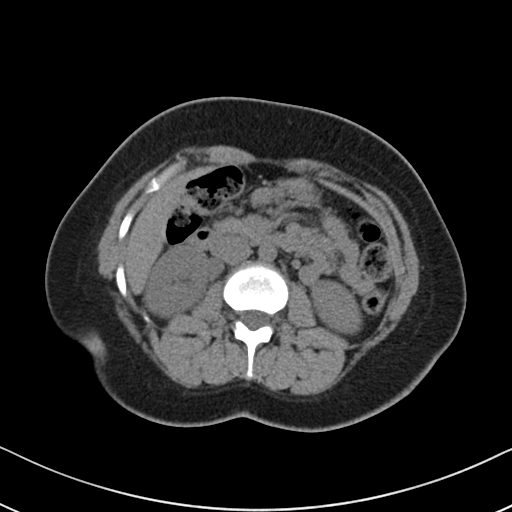
[im 66/87  soft-tissue]
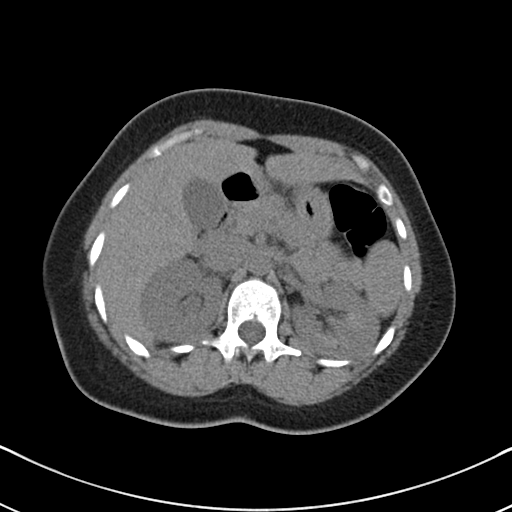
[im 70/87  soft-tissue]
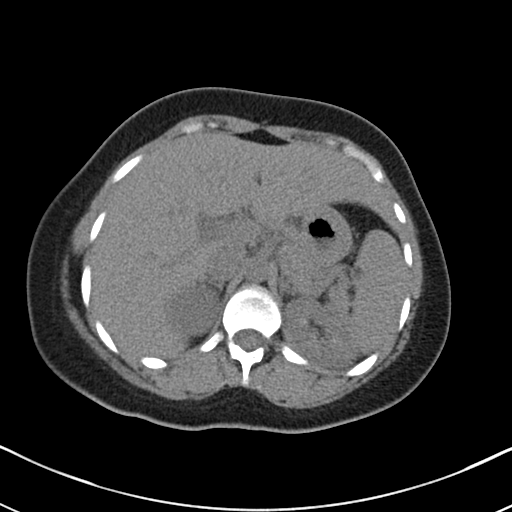
[im 74/87  soft-tissue]
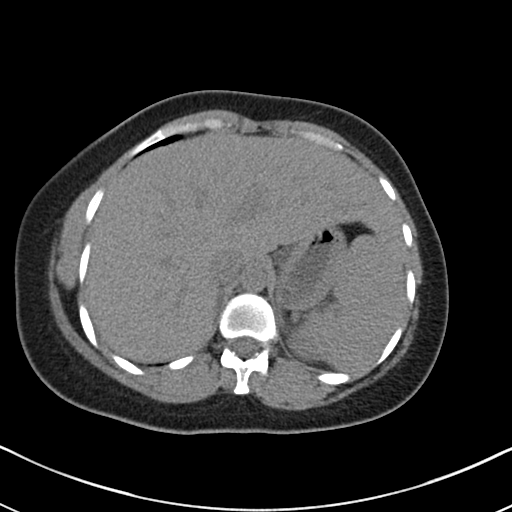
[im 82/87  soft-tissue]
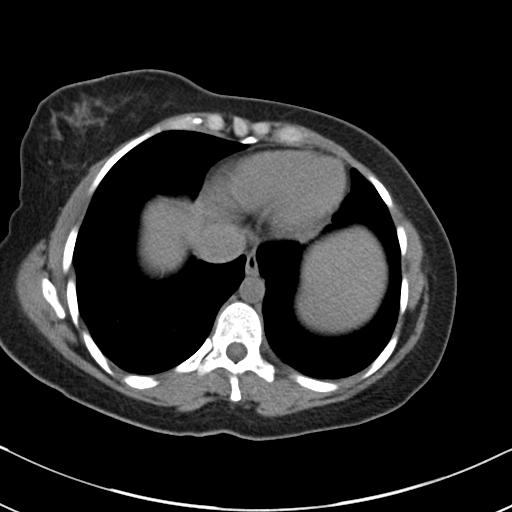

[Series 5: coronal · coronal · 0.76mm/px · 3 of 127 slices shown]
[im 43/127  soft-tissue]
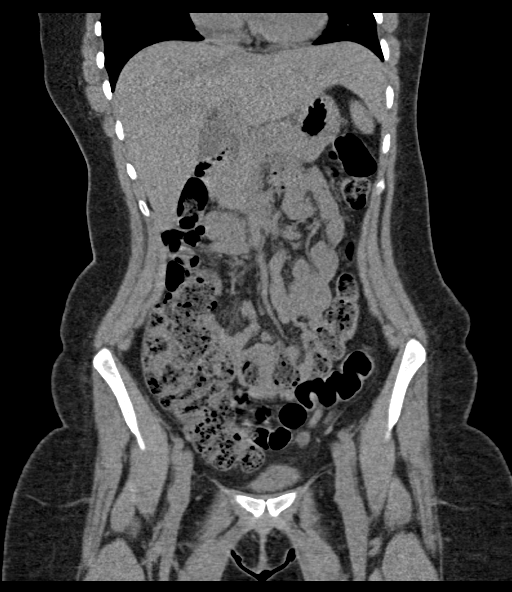
[im 57/127  soft-tissue]
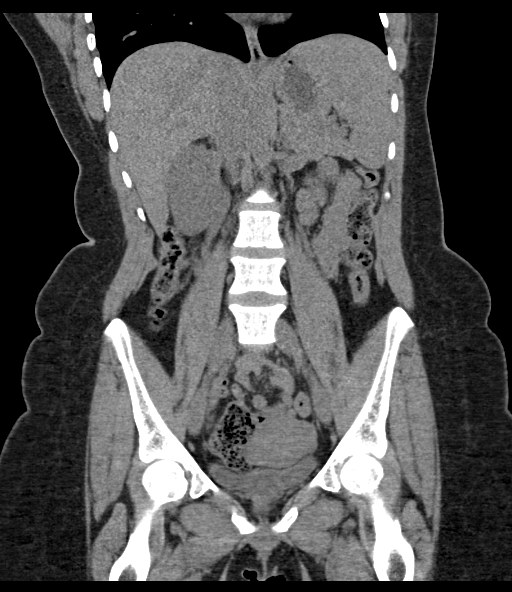
[im 71/127  soft-tissue]
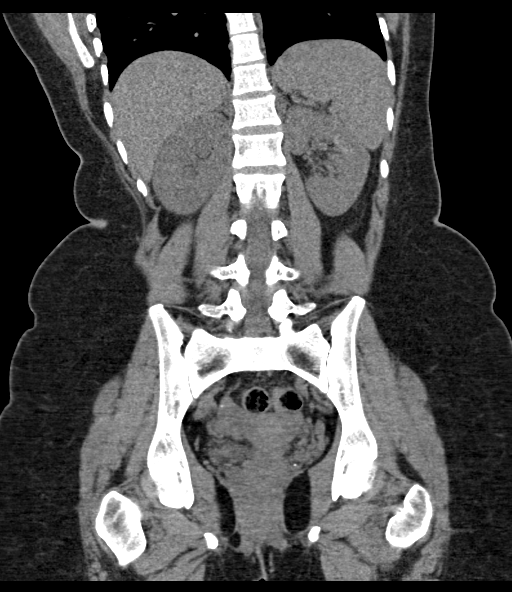

[17 of 46 positions shown; findings below may reference images not displayed]

FINDINGS: Lower chest:  Negative

Hepatobiliary: No focal liver abnormality.No evidence of biliary
obstruction or stone.

Pancreas: Unremarkable.

Spleen: Unremarkable.

Adrenals/Urinary Tract: Negative adrenals. Right
hydroureteronephrosis due to a UVJ calculus measuring 3 mm.
Unremarkable bladder.

Stomach/Bowel:  No obstruction. No appendicitis.

Vascular/Lymphatic: No acute vascular abnormality. No mass or
adenopathy.

Reproductive:No pathologic findings.

Other: No ascites or pneumoperitoneum.

Musculoskeletal: No acute abnormalities.
IMPRESSION: Right hydronephrosis from a 3 mm UVJ calculus.
